# Patient Record
Sex: Female | Born: 1985 | Race: White | Hispanic: No | Marital: Married | Smoking: Former smoker
Health system: Southern US, Community
[De-identification: ages and names within clinical notes are randomized; demographics above are authoritative.]

## PROBLEM LIST (undated history)

## (undated) DIAGNOSIS — F32A Depression, unspecified: Secondary | ICD-10-CM

## (undated) DIAGNOSIS — I341 Nonrheumatic mitral (valve) prolapse: Secondary | ICD-10-CM

## (undated) DIAGNOSIS — F329 Major depressive disorder, single episode, unspecified: Secondary | ICD-10-CM

## (undated) DIAGNOSIS — O24419 Gestational diabetes mellitus in pregnancy, unspecified control: Secondary | ICD-10-CM

## (undated) DIAGNOSIS — F988 Other specified behavioral and emotional disorders with onset usually occurring in childhood and adolescence: Secondary | ICD-10-CM

## (undated) DIAGNOSIS — F419 Anxiety disorder, unspecified: Secondary | ICD-10-CM

## (undated) HISTORY — DX: Major depressive disorder, single episode, unspecified: F32.9

## (undated) HISTORY — DX: Other specified behavioral and emotional disorders with onset usually occurring in childhood and adolescence: F98.8

## (undated) HISTORY — DX: Gestational diabetes mellitus in pregnancy, unspecified control: O24.419

## (undated) HISTORY — DX: Depression, unspecified: F32.A

## (undated) HISTORY — DX: Anxiety disorder, unspecified: F41.9

## (undated) HISTORY — PX: WISDOM TOOTH EXTRACTION: SHX21

---
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
## 2011-06-22 ENCOUNTER — Other Ambulatory Visit: Payer: Self-pay | Admitting: Nurse Practitioner

## 2011-06-22 ENCOUNTER — Other Ambulatory Visit (HOSPITAL_COMMUNITY)
Admission: RE | Admit: 2011-06-22 | Discharge: 2011-06-22 | Disposition: A | Discharge status: Home / Self Care | Payer: Self-pay | Source: Ambulatory Visit | Attending: Unknown Physician Specialty | Admitting: Unknown Physician Specialty

## 2011-06-22 DIAGNOSIS — R87619 Unspecified abnormal cytological findings in specimens from cervix uteri: Secondary | ICD-10-CM | POA: Insufficient documentation

## 2011-06-22 DIAGNOSIS — R87612 Low grade squamous intraepithelial lesion on cytologic smear of cervix (LGSIL): Secondary | ICD-10-CM | POA: Insufficient documentation

## 2013-08-23 ENCOUNTER — Encounter: Payer: Self-pay | Admitting: Women's Health

## 2013-08-23 ENCOUNTER — Other Ambulatory Visit (HOSPITAL_COMMUNITY)
Admission: RE | Admit: 2013-08-23 | Discharge: 2013-08-23 | Disposition: A | Discharge status: Home / Self Care | Payer: Commercial Managed Care - PPO | Source: Ambulatory Visit | Attending: Gynecology | Admitting: Gynecology

## 2013-08-23 ENCOUNTER — Ambulatory Visit (INDEPENDENT_AMBULATORY_CARE_PROVIDER_SITE_OTHER): Payer: Commercial Managed Care - PPO | Admitting: Women's Health

## 2013-08-23 VITALS — BP 122/80 | Ht 67.75 in | Wt 199.0 lb

## 2013-08-23 DIAGNOSIS — Z309 Encounter for contraceptive management, unspecified: Secondary | ICD-10-CM

## 2013-08-23 DIAGNOSIS — F341 Dysthymic disorder: Secondary | ICD-10-CM

## 2013-08-23 DIAGNOSIS — F32A Depression, unspecified: Secondary | ICD-10-CM | POA: Insufficient documentation

## 2013-08-23 DIAGNOSIS — Z01419 Encounter for gynecological examination (general) (routine) without abnormal findings: Secondary | ICD-10-CM

## 2013-08-23 DIAGNOSIS — F419 Anxiety disorder, unspecified: Secondary | ICD-10-CM

## 2013-08-23 DIAGNOSIS — IMO0001 Reserved for inherently not codable concepts without codable children: Secondary | ICD-10-CM

## 2013-08-23 DIAGNOSIS — F329 Major depressive disorder, single episode, unspecified: Secondary | ICD-10-CM

## 2013-08-23 MED ORDER — NORETHINDRONE 0.35 MG PO TABS: 1.0000 | ORAL_TABLET | Freq: Every day | ORAL | Status: DC

## 2013-08-23 NOTE — Patient Instructions (Signed)
Health Maintenance, Female A healthy lifestyle and preventative care can promote health and wellness.  Maintain regular health, dental, and eye exams.  Eat a healthy diet. Foods like vegetables, fruits, whole grains, low-fat dairy products, and lean protein foods contain the nutrients you need without too many calories. Decrease your intake of foods high in solid fats, added sugars, and salt. Get information about a proper diet from your caregiver, if necessary.  Regular physical exercise is one of the most important things you can do for your health. Most adults should get at least 150 minutes of moderate-intensity exercise (any activity that increases your heart rate and causes you to sweat) each week. In addition, most adults need muscle-strengthening exercises on 2 or more days a week.   Maintain a healthy weight. The body mass index (BMI) is a screening tool to identify possible weight problems. It provides an estimate of body fat based on height and weight. Your caregiver can help determine your BMI, and can help you achieve or maintain a healthy weight. For adults 20 years and older:  A BMI below 18.5 is considered underweight.  A BMI of 18.5 to 24.9 is normal.  A BMI of 25 to 29.9 is considered overweight.  A BMI of 30 and above is considered obese.  Maintain normal blood lipids and cholesterol by exercising and minimizing your intake of saturated fat. Eat a balanced diet with plenty of fruits and vegetables. Blood tests for lipids and cholesterol should begin at age 98 and be repeated every 5 years. If your lipid or cholesterol levels are high, you are over 50, or you are a high risk for heart disease, you may need your cholesterol levels checked more frequently.Ongoing high lipid and cholesterol levels should be treated with medicines if diet and exercise are not effective.  If you smoke, find out from your caregiver how to quit. If you do not use tobacco, do not start.  Lung  cancer screening is recommended for adults aged 49 80 years who are at high risk for developing lung cancer because of a history of smoking. Yearly low-dose computed tomography (CT) is recommended for people who have at least a 30-pack-year history of smoking and are a current smoker or have quit within the past 15 years. A pack year of smoking is smoking an average of 1 pack of cigarettes a day for 1 year (for example: 1 pack a day for 30 years or 2 packs a day for 15 years). Yearly screening should continue until the smoker has stopped smoking for at least 15 years. Yearly screening should also be stopped for people who develop a health problem that would prevent them from having lung cancer treatment.  If you are pregnant, do not drink alcohol. If you are breastfeeding, be very cautious about drinking alcohol. If you are not pregnant and choose to drink alcohol, do not exceed 1 drink per day. One drink is considered to be 12 ounces (355 mL) of beer, 5 ounces (148 mL) of wine, or 1.5 ounces (44 mL) of liquor.  Avoid use of street drugs. Do not share needles with anyone. Ask for help if you need support or instructions about stopping the use of drugs.  High blood pressure causes heart disease and increases the risk of stroke. Blood pressure should be checked at least every 1 to 2 years. Ongoing high blood pressure should be treated with medicines, if weight loss and exercise are not effective.  If you are 55 to  28 years old, ask your caregiver if you should take aspirin to prevent strokes.  Diabetes screening involves taking a blood sample to check your fasting blood sugar level. This should be done once every 3 years, after age 88, if you are within normal weight and without risk factors for diabetes. Testing should be considered at a younger age or be carried out more frequently if you are overweight and have at least 1 risk factor for diabetes.  Breast cancer screening is essential preventative care  for women. You should practice "breast self-awareness." This means understanding the normal appearance and feel of your breasts and may include breast self-examination. Any changes detected, no matter how small, should be reported to a caregiver. Women in their 59s and 30s should have a clinical breast exam (CBE) by a caregiver as part of a regular health exam every 1 to 3 years. After age 27, women should have a CBE every year. Starting at age 24, women should consider having a mammogram (breast X-ray) every year. Women who have a family history of breast cancer should talk to their caregiver about genetic screening. Women at a high risk of breast cancer should talk to their caregiver about having an MRI and a mammogram every year.  Breast cancer gene (BRCA)-related cancer risk assessment is recommended for women who have family members with BRCA-related cancers. BRCA-related cancers include breast, ovarian, tubal, and peritoneal cancers. Having family members with these cancers may be associated with an increased risk for harmful changes (mutations) in the breast cancer genes BRCA1 and BRCA2. Results of the assessment will determine the need for genetic counseling and BRCA1 and BRCA2 testing.  The Pap test is a screening test for cervical cancer. Women should have a Pap test starting at age 35. Between ages 72 and 13, Pap tests should be repeated every 2 years. Beginning at age 39, you should have a Pap test every 3 years as long as the past 3 Pap tests have been normal. If you had a hysterectomy for a problem that was not cancer or a condition that could lead to cancer, then you no longer need Pap tests. If you are between ages 31 and 57, and you have had normal Pap tests going back 10 years, you no longer need Pap tests. If you have had past treatment for cervical cancer or a condition that could lead to cancer, you need Pap tests and screening for cancer for at least 20 years after your treatment. If Pap  tests have been discontinued, risk factors (such as a new sexual partner) need to be reassessed to determine if screening should be resumed. Some women have medical problems that increase the chance of getting cervical cancer. In these cases, your caregiver may recommend more frequent screening and Pap tests.  The human papillomavirus (HPV) test is an additional test that may be used for cervical cancer screening. The HPV test looks for the virus that can cause the cell changes on the cervix. The cells collected during the Pap test can be tested for HPV. The HPV test could be used to screen women aged 23 years and older, and should be used in women of any age who have unclear Pap test results. After the age of 104, women should have HPV testing at the same frequency as a Pap test.  Colorectal cancer can be detected and often prevented. Most routine colorectal cancer screening begins at the age of 58 and continues through age 65. However, your caregiver  may recommend screening at an earlier age if you have risk factors for colon cancer. On a yearly basis, your caregiver may provide home test kits to check for hidden blood in the stool. Use of a small camera at the end of a tube, to directly examine the colon (sigmoidoscopy or colonoscopy), can detect the earliest forms of colorectal cancer. Talk to your caregiver about this at age 73, when routine screening begins. Direct examination of the colon should be repeated every 5 to 10 years through age 61, unless early forms of pre-cancerous polyps or small growths are found.  Hepatitis C blood testing is recommended for all people born from [birth year] through 1965 and any individual with known risks for hepatitis C.  Practice safe sex. Use condoms and avoid high-risk sexual practices to reduce the spread of sexually transmitted infections (STIs). Sexually active women aged 38 and younger should be checked for Chlamydia, which is a common sexually transmitted infection.  Older women with new or multiple partners should also be tested for Chlamydia. Testing for other STIs is recommended if you are sexually active and at increased risk.  Osteoporosis is a disease in which the bones lose minerals and strength with aging. This can result in serious bone fractures. The risk of osteoporosis can be identified using a bone density scan. Women ages 44 and over and women at risk for fractures or osteoporosis should discuss screening with their caregivers. Ask your caregiver whether you should be taking a calcium supplement or vitamin D to reduce the rate of osteoporosis.  Menopause can be associated with physical symptoms and risks. Hormone replacement therapy is available to decrease symptoms and risks. You should talk to your caregiver about whether hormone replacement therapy is right for you.  Use sunscreen. Apply sunscreen liberally and repeatedly throughout the day. You should seek shade when your shadow is shorter than you. Protect yourself by wearing long sleeves, pants, a wide-brimmed hat, and sunglasses year round, whenever you are outdoors.  Notify your caregiver of new moles or changes in moles, especially if there is a change in shape or color. Also notify your caregiver if a mole is larger than the size of a pencil eraser.  Stay current with your immunizations. Document Released: 10/12/2010 Document Revised: 07/24/2012 Document Reviewed: 10/12/2010 [facility] Patient Information 2014 [place].

## 2013-08-23 NOTE — Progress Notes (Signed)
Tara IsaacsLacy J Lucero 21-Jul-1985 [id number]    History:    Presents for annual exam.  New patient. 2013 C&B negative after Pap [facility] Negative STD screen. Amenorrheic on Micronor. An[facility]/depression [facility] on Celexa, Lamictal and Neurontin. Tearful most of visit, contemplating a pregnancy in the next year or 2..   Past medical history, past surgical history, family history and social history were all reviewed and documented in the EPIC chart. 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 Mother with severe depression.  ROS:  A  12 point ROS was performed and pertinent positives and negatives are included.  Exam:  Filed Vitals:   08/23/13 1419  BP: 122/80    General appearance:  Normal Thyroid:  Symmetrical, normal in size, without palpable masses or nodularity. Respiratory  Auscultation:  Clear without wheezing or rhonchi Cardiovascular  Auscultation:  Regular rate, without rubs, murmurs or gallops  Edema/varicosities:  Not grossly evident Abdominal  Soft,nontender, without masses, guarding or rebound.  Liver/spleen:  No organomegaly noted  Hernia:  None appreciated  Skin  Inspection:  Grossly normal   Breasts: Examined lying and sitting.     Right: Without masses, retractions, discharge or axillary adenopathy.     Left: Without masses, retractions, discharge or axillary adenopathy. Gentitourinary   Inguinal/mons:  Normal without inguinal adenopathy  External genitalia:  Normal  BUS/Urethra/Skene's glands:  Normal  Vagina:  Normal  Cervix:  Normal  Uterus:  normal in size, shape and contour.  Midline and mobile  Adnexa/parametria:     Rt: Without masses or tenderness.   Lt: Without masses or tenderness.  Anus and perineum: Normal  Digital rectal exam: Normal sphincter tone without palpated masses or tenderness  Assessment/Plan:  28 y.o. MWF G0 for annual exam.    Amenorrheic on  Micronor Anxiety/depression Positive HR HPV with negative colposcopy 2013  Plan: CBC, TSH, rubella titer will have checked at [facility] and have results faxed. SBE's, regular exercise, calcium rich diet, MVI daily encouraged. Micronor prescription, proper use reviewed. Continue counseling, discussed medications currently on are category C some risks for fetal toxicity,  cleft lip and palate/cardiovascular defects. and safety with pregnancy, tapering to lower doses. Aware we no longer deliver. Pap.   Note: This dictation was prepared with Dragon/digital dictation.  Any transcriptional errors that result are unintentional. Harrington Challengerancy J Young [facility]NP, 5:33 PM 08/23/2013

## 2013-10-11 ENCOUNTER — Ambulatory Visit (INDEPENDENT_AMBULATORY_CARE_PROVIDER_SITE_OTHER): Payer: Commercial Managed Care - PPO | Admitting: Women's Health

## 2013-10-11 ENCOUNTER — Encounter: Payer: Self-pay | Admitting: Women's Health

## 2013-10-11 ENCOUNTER — Other Ambulatory Visit: Payer: Self-pay | Admitting: Women's Health

## 2013-10-11 DIAGNOSIS — N912 Amenorrhea, unspecified: Secondary | ICD-10-CM

## 2013-10-11 LAB — HCG, SERUM, QUALITATIVE: Preg, Serum: POSITIVE
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
## 2013-10-11 NOTE — Progress Notes (Signed)
Patient ID: Tara IsaacsLacy J Lucero, female   DOB: 05/21/1985, 28 y.o.   MRN: [id number] Presents with 3 positive home UPT's. Stopped  Micronor June 2,  3 day light dark blood cycle June 11.  Regular 3 day cycle May 16. Had irregular monthly cycles on Micronor. Bipolar on Lamictal, Neurontin, Adderall and Celexa. Has stopped all medication except Celexa states feels anxious when off. On prenatal vitamin daily.  Exam: Appears well. U PT negative,  Possible pregnancy  Plan: HCG qualitative. If positive will schedule viability ultrasound end of July. Discussed trying Celexa 20 mg daily,  continue prenatal vitamins. Safe pregnancy behaviors discussed.

## 2013-10-20 ENCOUNTER — Encounter (HOSPITAL_COMMUNITY): Payer: Self-pay | Admitting: Emergency Medicine

## 2013-10-20 ENCOUNTER — Emergency Department (HOSPITAL_COMMUNITY): Payer: Commercial Managed Care - PPO

## 2013-10-20 ENCOUNTER — Emergency Department (HOSPITAL_COMMUNITY)
Admission: EM | Admit: 2013-10-20 | Discharge: 2013-10-20 | Disposition: A | Discharge status: Home / Self Care | Payer: Commercial Managed Care - PPO | Attending: Emergency Medicine | Admitting: Emergency Medicine

## 2013-10-20 DIAGNOSIS — O200 Threatened abortion: Secondary | ICD-10-CM

## 2013-10-20 DIAGNOSIS — Z8679 Personal history of other diseases of the circulatory system: Secondary | ICD-10-CM | POA: Insufficient documentation

## 2013-10-20 DIAGNOSIS — F329 Major depressive disorder, single episode, unspecified: Secondary | ICD-10-CM | POA: Insufficient documentation

## 2013-10-20 DIAGNOSIS — O99340 Other mental disorders complicating pregnancy, unspecified trimester: Secondary | ICD-10-CM | POA: Insufficient documentation

## 2013-10-20 DIAGNOSIS — Z87891 Personal history of nicotine dependence: Secondary | ICD-10-CM | POA: Insufficient documentation

## 2013-10-20 DIAGNOSIS — Z79899 Other long term (current) drug therapy: Secondary | ICD-10-CM | POA: Insufficient documentation

## 2013-10-20 DIAGNOSIS — F411 Generalized anxiety disorder: Secondary | ICD-10-CM | POA: Insufficient documentation

## 2013-10-20 DIAGNOSIS — F3289 Other specified depressive episodes: Secondary | ICD-10-CM | POA: Insufficient documentation

## 2013-10-20 HISTORY — DX: Nonrheumatic mitral (valve) prolapse: I34.1

## 2013-10-20 LAB — ABO/RH: ABO/RH(D): A POS

## 2013-10-20 LAB — URINALYSIS, ROUTINE W REFLEX MICROSCOPIC
Bilirubin Urine: NEGATIVE
Glucose, UA: NEGATIVE mg/dL
Ketones, ur: NEGATIVE mg/dL
Leukocytes, UA: NEGATIVE
Nitrite: NEGATIVE
Specific Gravity, Urine: 1.015 (ref 1.005–1.030)
Urobilinogen, UA: 0.2 mg/dL (ref 0.0–1.0)
pH: 7 (ref 5.0–8.0)

## 2013-10-20 LAB — CBC WITH DIFFERENTIAL/PLATELET
Basophils Absolute: 0 10*3/uL (ref 0.0–0.1)
Basophils Relative: 0 % (ref 0–1)
Eosinophils Absolute: 0.2 10*3/uL (ref 0.0–0.7)
Eosinophils Relative: 2 % (ref 0–5)
HCT: 40 % (ref 36.0–46.0)
Hemoglobin: 13.1 g/dL (ref 12.0–15.0)
Lymphocytes Relative: 26 % (ref 12–46)
Lymphs Abs: 2.5 10*3/uL (ref 0.7–4.0)
MCH: 29.4 pg (ref 26.0–34.0)
MCHC: 32.8 g/dL (ref 30.0–36.0)
MCV: 89.7 fL (ref 78.0–100.0)
Monocytes Absolute: 0.7 10*3/uL (ref 0.1–1.0)
Monocytes Relative: 7 % (ref 3–12)
Neutro Abs: 6.3 10*3/uL (ref 1.7–7.7)
Neutrophils Relative %: 65 % (ref 43–77)
Platelets: 221 10*3/uL (ref 150–400)
RBC: 4.46 MIL/uL (ref 3.87–5.11)
RDW: 13 % (ref 11.5–15.5)
WBC: 9.7 10*3/uL (ref 4.0–10.5)

## 2013-10-20 LAB — URINE MICROSCOPIC-ADD ON

## 2013-10-20 LAB — BASIC METABOLIC PANEL
Anion gap: 11 (ref 5–15)
BUN: 12 mg/dL (ref 6–23)
CO2: 25 mEq/L (ref 19–32)
Calcium: 9 mg/dL (ref 8.4–10.5)
Chloride: 103 mEq/L (ref 96–112)
Creatinine, Ser: 0.86 mg/dL (ref 0.50–1.10)
GFR calc Af Amer: >90 mL/min (ref >90)
GFR calc non Af Amer: >90 mL/min (ref >90)
Glucose, Bld: 98 mg/dL (ref 70–99)
Potassium: 4 mEq/L (ref 3.7–5.3)
Sodium: 139 mEq/L (ref 137–147)

## 2013-10-20 LAB — WET PREP, GENITAL
Clue Cells Wet Prep HPF POC: NONE SEEN
Trich, Wet Prep: NONE SEEN
Yeast Wet Prep HPF POC: NONE SEEN

## 2013-10-20 LAB — RPR

## 2013-10-20 LAB — HIV ANTIBODY (ROUTINE TESTING W REFLEX): HIV 1&2 Ab, 4th Generation: NONREACTIVE

## 2013-10-20 LAB — HCG, QUANTITATIVE, PREGNANCY: hCG, Beta Chain, Quant, S: 74 m[IU]/mL — ABNORMAL HIGH (ref <5)

## 2013-10-20 NOTE — ED Provider Notes (Signed)
CSN: [id number]     Arrival date & time 10/20/13  1245 History   First MD Initiated Contact with Patient 10/20/13 1353     This chart was scribed for Ward Givens, MD by Arlan Organ, ED Scribe. This patient was seen in room APA12/APA12 and the patient's care was started 2:43 PM.   Chief Complaint  Patient presents with  . Abdominal Pain   HPI  HPI Comments: Tara Lucero is a 28 y.o. G8P1Ab0  female with a PMHx of anxiety, depression, ADD who presents to the Emergency Department complaining of constant, moderate abdominal pain onset this morning around 12 noon. Currently she rates pain 6/10 and states abdominal pain is alleviated temporarily after bowel movements. She also reports vaginal bleeding that started about 8 AM the same consistency of a menstrual period. She is having small clots which is not unusual. She has not passed anything gray or sounds like tissue. No fever or chills at this time. Pt recently stopped taking her birth control 6/2 (planned). LMP 09/12/13 but was shorter than usual, her last normal period was May 16.. She reports a positive home pregnancy test on 6/20 and a postive blood test on 7/2 at her OB/GYN office.. Pt is currently a respiratory therapist at [employer]. No other concerns this visit.  Pt is followed by [facility] for Anxiety, Depression, and ADD PCP none  Past Medical History  Diagnosis Date  . Anxiety   . ADD (attention deficit disorder)   . Depression   . Mitral valve prolapse    History reviewed. No pertinent past surgical history. Family History  Problem Relation Age of Onset  . COPD Maternal Grandfather    History  Substance Use Topics  . Smoking status: Former Smoker    Quit date: 08/11/2011  . Smokeless tobacco: Never Used  . Alcohol Use: No   Employed as a respiratory therapist   OB History   Grav Para Term Preterm Abortions TAB SAB Ect Mult Living   0    0          Review of Systems  Constitutional: Negative  for fever and chills.  Gastrointestinal: Positive for abdominal pain.  Genitourinary: Positive for vaginal bleeding.  Musculoskeletal: Positive for back pain.      Allergies  Review of patient's allergies indicates no known allergies.  Home Medications   Prior to Admission medications   Medication Sig Start Date End Date Taking? Authorizing Provider  citalopram (CELEXA) 40 MG tablet Take 40 mg by mouth daily.   Yes Historical Provider, MD  Prenatal Vit-Fe Fumarate-FA (PRENATAL MULTIVITAMIN) TABS tablet Take 1 tablet by mouth daily at 12 noon.   Yes Historical Provider, MD   Triage Vitals: BP 139/87  Pulse 85  Temp(Src) 98.1 F (36.7 C) (Oral)  Resp 18  Ht 5\' 7"  (1.702 m)  Wt 200 lb (90.719 kg)  BMI 31.32 kg/m2  SpO2 100%  LMP 09/14/2013   Vital signs normal    Physical Exam  Nursing note and vitals reviewed. Constitutional: She is oriented to person, place, and time. She appears well-developed and well-nourished.  HENT:  Head: Normocephalic and atraumatic.  Right Ear: External ear normal.  Left Ear: External ear normal.  Nose: Nose normal.  Mouth/Throat: Oropharynx is clear and moist.  Eyes: Conjunctivae and EOM are normal. Pupils are equal, round, and reactive to light.  Neck: Normal range of motion. Neck supple.  Cardiovascular: Normal rate, regular rhythm and normal heart sounds.  Pulmonary/Chest: Effort normal and breath sounds normal.  Abdominal: Soft. Bowel sounds are normal. There is tenderness.    Genitourinary:  Patient has normal external genitalia. She is noted to have some blood in the vault. It is not bright red or brown. Her cervix is bluish in color (Chadwick's sign). Her uterus and left adnexa feel normal and are minimally tender. Her right adnexa however is very tender without masses felt. Her cervix feels firm and feels closed.  Musculoskeletal: Normal range of motion. She exhibits tenderness.  Lower sacral tenderness  Neurological: She is alert  and oriented to person, place, and time.  Skin: Skin is warm and dry.  Psychiatric: She has a normal mood and affect. Her behavior is normal.    ED Course  Procedures (including critical care time)  DIAGNOSTIC STUDIES: Oxygen Saturation is 100% on RA, Normal by my interpretation.    COORDINATION OF CARE: 3:00 PM- Will order Urinalysis, hcG, CBC, and BMP. Discussed treatment plan with pt at bedside and pt agreed to plan.    Bedside pelvic ultrasound was done during my initial interview which did not show any intrauterine pregnancy. After her pelvic exam with significant pain over the right adnexa a in-house pelvic ultrasound was ordered.  On review of her prior tests patient only had a qualitative beta hCG done on July 2. It was positive.  We had a long discussion about what is going on today. We discussed that with her positive pregnancy test on June 20 and her beta hCG only been 8874 today this could be a sign of a pregnancy that is being lost. She also could be a very early pregnancy with a threatened miscarriage. And also to be considered as a possible ectopic pregnancy. However there was no free fluid seen on ultrasound and no masses on her ovary or her fallopian tubes. We discussed the importance of getting a repeat beta hCG levels on in 2 days which would be Monday, July 13. I gave her a prescription so [facility]. She can then followup with her GYN office later that day. We also discussed pelvic rest. [facility] if  her pain gets worse or she starts bleeding heavier than a regular period.   EMERGENCY DEPARTMENT US PREGNANCY "Study: Limited Ultrasound of the Pelvis"  INDICATIONS:Pregnancy(required) and Vaginal bleeding Multiple views of the uterus and pelvic cavity are obtained with a multi-frequency probe.  APPROACH:Transabdominal   PERFORMED BY: Myself  IMAGES ARCHIVED?:  Yes  LIMITATIONS: Decompressed bladder  PREGNANCY FREE FLUID: None  PREGNANCY UTERUS FINDINGS:Uterus normal size ADNEXAL FINDINGS: not studied  PREGNANCY FINDINGS: No yolk sac noted and No fetal pole seen  INTERPRETATION: No visualized intrauterine pregnancy and Pelvic free fluid absent       Labs Review Results for orders placed during the hospital encounter of 10/20/13  WET PREP, GENITAL      Result Value Ref Range   Yeast Wet Prep HPF POC NONE SEEN  NONE SEEN   Trich, Wet Prep NONE SEEN  NONE SEEN   Clue Cells Wet Prep HPF POC NONE SEEN  NONE SEEN   WBC, Wet Prep HPF POC FEW (*) NONE SEEN  CBC WITH DIFFERENTIAL      Result Value Ref Range   WBC 9.7  4.0 - 10.5 K/uL   RBC 4.46  3.87 - 5.11 MIL/uL   Hemoglobin 13.1  12.0 - 15.0 g/dL  HCT 40.0  36.0 - 46.0 %   MCV 89.7  78.0 - 100.0 fL   MCH 29.4  26.0 - 34.0 pg   MCHC 32.8  30.0 - 36.0 g/dL   RDW 16.1  09.6 - 04.5 %   Platelets 221  150 - 400 K/uL   Neutrophils Relative % 65  43 - 77 %   Neutro Abs 6.3  1.7 - 7.7 K/uL   Lymphocytes Relative 26  12 - 46 %   Lymphs Abs 2.5  0.7 - 4.0 K/uL   Monocytes Relative 7  3 - 12 %   Monocytes Absolute 0.7  0.1 - 1.0 K/uL   Eosinophils Relative 2  0 - 5 %   Eosinophils Absolute 0.2  0.0 - 0.7 K/uL   Basophils Relative 0  0 - 1 %   Basophils Absolute 0.0  0.0 - 0.1 K/uL  BASIC METABOLIC PANEL      Result Value Ref Range   Sodium 139  137 - 147 mEq/L   Potassium 4.0  3.7 - 5.3 mEq/L   Chloride 103  96 - 112 mEq/L   CO2 25  19 - 32 mEq/L   Glucose, Bld 98  70 - 99 mg/dL   BUN 12  6 - 23 mg/dL   Creatinine, Ser 4.09  0.50 - 1.10 mg/dL   Calcium 9.0  8.4 - 81.1 mg/dL   GFR calc non Af Amer >90  >90 mL/min   GFR calc Af Amer >90  >90 mL/min   Anion gap 11  5 - 15  URINALYSIS, ROUTINE W REFLEX MICROSCOPIC      Result Value Ref Range   Color, Urine RED (*) YELLOW   APPearance CLOUDY (*) CLEAR   Specific Gravity, Urine 1.015  1.005 - 1.030   pH 7.0  5.0 - 8.0   Glucose,  UA NEGATIVE  NEGATIVE mg/dL   Hgb urine dipstick LARGE (*) NEGATIVE   Bilirubin Urine NEGATIVE  NEGATIVE   Ketones, ur NEGATIVE  NEGATIVE mg/dL   Protein, ur TRACE (*) NEGATIVE mg/dL   Urobilinogen, UA 0.2  0.0 - 1.0 mg/dL   Nitrite NEGATIVE  NEGATIVE   Leukocytes, UA NEGATIVE  NEGATIVE  HCG, QUANTITATIVE, PREGNANCY      Result Value Ref Range   hCG, Beta Chain, Quant, S 74 (*) <5 mIU/mL  URINE MICROSCOPIC-ADD ON      Result Value Ref Range   Squamous Epithelial / LPF FEW (*) RARE   RBC / HPF TOO NUMEROUS TO COUNT  <3 RBC/hpf   Bacteria, UA FEW (*) RARE   Urine-Other SPERM PRESENT    ABO/RH      Result Value Ref Range   ABO/RH(D) A POS     Laboratory interpretation all normal except very low quantitative beta hCG, hematuria and a voided urine with vaginal bleeding     Imaging Review US Ob Comp Less 14 Wks US Ob Transvaginal  10/20/2013   CLINICAL DATA:  Right lower quadrant pelvic pain, vaginal bleeding. Positive pregnancy test.  EXAM: OBSTETRIC <14 WK [place] AND TRANSVAGINAL OB US  TECHNIQUE: Both transabdominal and transvaginal ultrasound examinations were performed for complete evaluation of the gestation as well as the maternal uterus, adnexal regions, and pelvic cul-de-sac. Transvaginal technique was performed to assess early pregnancy.  COMPARISON:  None.  FINDINGS: Intrauterine gestational sac: Not visualized  Yolk sac:  Not visualized  Embryo:  Not visualized  Cardiac Activity: Not visualized  Maternal uterus/adnexae: The ovaries are normal.  0.9 cm right paraovarian simple appearing cyst incidentally noted. No adnexal mass. No free fluid.  IMPRESSION: No intrauterine gestational sac, yolk sac, or fetal pole identified. Differential considerations include intrauterine pregnancy too early to be sonographically visualized, missed abortion, or ectopic pregnancy. Followup ultrasound is recommended in 10-14 days for further evaluation.   Electronically Signed   By: Christiana Pellant M.D.    On: 10/20/2013 18:44     EKG Interpretation None      MDM   Final diagnoses:  Threatened miscarriage in early pregnancy    Plan discharge  Devoria Albe, MD, FACEP   I personally performed the services described in this documentation, which was scribed in my presence. The recorded information has been reviewed and considered.  Devoria Albe, MD, Armando Gang     Ward Givens, MD 10/20/13 2106

## 2013-10-20 NOTE — ED Notes (Signed)
PT woke up this morning around 0800 with lower back pain and suprapubic pain. PT started bleeding bright red blood vaginally about 1215 today. PT states she is around [redacted]wks pregnant.

## 2013-10-20 NOTE — ED Notes (Signed)
Patient relates she is [redacted] weeks pregnant.  Got pregnant immediately after stopping BC, prior to having a cycle.

## 2013-10-20 NOTE — Discharge Instructions (Signed)
You need to have a repeat pregnancy hormone level (B HCG) done in two days which will be Monday, July 13th. If you get worsening pain, bleeding heavier than a period or you feel worse, [facility] to the Maternity Ad[facility] Unit. You can take tylenol if needed for pain. You pregnancy hormone level today was only 74. Your blood type is A +. There was no evidence of infection.  No douching, tampons or sex until you see your gynecologist this week in the office.    Vaginal Bleeding During Pregnancy, First Trimester A small amount of bleeding (spotting) from the vagina is relatively common in early pregnancy. It usually stops on its own. Various things may cause bleeding or spotting in early pregnancy. Some bleeding may be related to the pregnancy, and some may not. In most cases, the bleeding is normal and is not a problem. However, bleeding can also be a sign of something serious. Be sure to tell your health care provider about any vaginal bleeding right away. Some possible causes of vaginal bleeding during the first trimester include:  Infection or inflammation of the cervix.  Growths (polyps) on the cervix.  Miscarriage or threatened miscarriage.  Pregnancy tissue has developed outside of the uterus and in a fallopian tube (tubal pregnancy).  Tiny cysts have developed in the uterus instead of pregnancy tissue (molar pregnancy). HOME CARE INSTRUCTIONS  Watch your condition for any changes. The following actions may help to lessen any discomfort you are feeling:  Follow your health care provider's instructions for limiting your activity. If your health care provider orders bed rest, you may need to stay in bed and only get up to use the bathroom. However, your health care provider may allow you to continue light activity.  If needed, make plans for someone to help with your regular activities and responsibilities while you are on bed rest.  Keep track of the number of pads you use  each day, how often you change pads, and how soaked (saturated) they are. Write this down.  Do not use tampons. Do not douche.  Do not have sexual intercourse or orgasms until approved by your health care provider.  If you pass any tissue from your vagina, save the tissue so you can show it to your health care provider.  Only take over-the-counter or prescription medicines as directed by your health care provider.  Do not take aspirin because it can make you bleed.  Keep all follow-up appointments as directed by your health care provider. SEEK MEDICAL CARE IF:  You have any vaginal bleeding during any part of your pregnancy.  You have cramps or labor pains.  You have a fever, not controlled by medicine. SEEK IMMEDIATE MEDICAL CARE IF:   You have severe cramps in your back or belly (abdomen).  You pass large clots or tissue from your vagina.  Your bleeding increases.  You feel light-headed or weak, or you have fainting episodes.  You have chills.  You are leaking fluid or have a gush of fluid from your vagina.  You pass out while having a bowel movement. MAKE SURE YOU:  Understand these instructions.  Will watch your condition.  Will get help right away if you are not doing well or get worse. Document Released: 01/06/2005 Document Revised: 04/03/2013 Document Reviewed: 12/04/2012 [employer] Patient Information 2015 [place]. This information is not intended to replace advice given to you by your health care provider. Make sure you discuss any questions you have  with your health care provider.    Human Chorionic Gonadotropin (hCG) This is a test to confirm and monitor pregnancy or to diagnose trophoblastic disease or germ cell tumors. As early as 10 days after a missed menstrual period (some methods can detect hCG even earlier, at one week after conception) or if your caregiver thinks that your symptoms suggest ectopic pregnancy, a failing pregnancy, trophoblastic  disease, or germ cell tumors. hCG is a protein produced in the placenta of a pregnant woman. A pregnancy test is a specific blood or urine test that can detect hCG and confirm pregnancy. This hormone is able to be detected 10 days after a missed menstrual period, the time period when the fertilized egg is implanted in the woman's uterus. With some methods, hCG can be detected even earlier, at one week after conception.  During the early weeks of pregnancy, hCG is important in maintaining function of the corpus luteum (the mass of cells that forms from a mature egg). Production of hCG increases steadily during the first trimester (8-10 weeks), peaking around the 10th week after the last menstrual cycle. Levels then fall slowly during the remainder of the pregnancy. hCG is no longer detectable within a few weeks after delivery. hCG is also produced by some germ cell tumors and increased levels are seen in trophoblastic disease. SAMPLE COLLECTION hCG is commonly detected in urine. The preferred specimen is a random urine sample collected first thing in the morning. hCG can also be measured in blood drawn from a vein in the arm. NORMAL FINDINGS Qualitative:negative in non-pregnant women; positive in pregnancy Quantitative:   Gestation less than 1 week: 5-50 Whole HCG (milli-international units/mL)  Gestation of 2 weeks: 50-500 Whole HCG (milli-international units/mL)  Gestation of 3 weeks: 100-10,000 Whole HCG (milli-international units/mL)  Gestation of 4 weeks: 1,000-30,000 Whole HCG (milli-international units/mL)  Gestation of 5 weeks 3,500-115,000 Whole HCG (milli-international units/mL)  Gestation of 6-8 weeks: 12,000-270,000 Whole HCG (milli-international units/mL)  Gestation of 12 weeks: 15,000-220,000 Whole HCG (milli-international units/mL)  Males and non-pregnant females: less than 5 Whole HCG (milli-international units/mL) Beta subunit: depends on the method and test used Ranges for  normal findings may vary among different laboratories and hospitals. You should always check with your doctor after having lab work or other tests done to discuss the meaning of your test results and whether your values are considered within normal limits. MEANING OF TEST  Your caregiver will go over the test results with you and discuss the importance and meaning of your results, as well as treatment options and the need for additional tests if necessary. OBTAINING THE TEST RESULTS It is your responsibility to obtain your test results. Ask the lab or department performing the test when and how you will get your results. Document Released: 04/30/2004 Document Revised: 06/21/2011 Document Reviewed: 03/09/2008 [facility] Patient Information 2015 [place], [place]. This information is not intended to replace advice given to you by your health care provider. Make sure you discuss any questions you have with your health care provider.

## 2013-10-22 ENCOUNTER — Other Ambulatory Visit: Payer: Self-pay | Admitting: Gynecology

## 2013-10-22 ENCOUNTER — Ambulatory Visit (INDEPENDENT_AMBULATORY_CARE_PROVIDER_SITE_OTHER): Payer: Commercial Managed Care - PPO | Admitting: Gynecology

## 2013-10-22 ENCOUNTER — Encounter: Payer: Self-pay | Admitting: Gynecology

## 2013-10-22 ENCOUNTER — Telehealth: Payer: Self-pay

## 2013-10-22 DIAGNOSIS — O200 Threatened abortion: Secondary | ICD-10-CM

## 2013-10-22 LAB — GC/CHLAMYDIA PROBE AMP
CT Probe RNA: NEGATIVE
GC Probe RNA: NEGATIVE

## 2013-10-22 LAB — HCG, QUANTITATIVE, PREGNANCY: hCG, Beta Chain, Quant, S: 97.03 m[IU]/mL

## 2013-10-22 NOTE — Telephone Encounter (Signed)
I noticed as I was calling patient that she has an appt at 11am.  I left message on her voice mail that I will add quant order and she can just have it drawn when she comes in this morning.

## 2013-10-22 NOTE — Progress Notes (Signed)
   The patient is a 28 year old gravida 1 para 0 AB 0 who was seen in the emergency room in July 11 complaints and cramping vaginal bleeding. Patient reports last menstrual cycle 09/12/2013. Patient was complaining of some bleeding still present today and mild cramping. Laboratory as well as ultrasound evaluation for emergency room visit as follows:  Ultrasound: IMPRESSION:  No intrauterine gestational sac, yolk sac, or fetal pole identified.  Differential considerations include intrauterine pregnancy too early  to be sonographically visualized, missed abortion, or ectopic  pregnancy. Followup ultrasound is recommended in 10-14 days for  further evaluation.  CBC: Hemoglobin and hematocrit 13.1 and 40.0 respectively with a platelet count 221,000  Quantitative beta hCGs as follows: Results for Demetrios IsaacsGREGG, Shariya J (MRN [id number]) as of 10/22/2013 11:36  Ref. Range 10/20/2013 14:07 10/20/2013 16:06 10/22/2013 09:20  hCG, Beta Chain, Quant, S No range found 74 (H)  97.03   Blood type A positive  Exam: Abdomen: Soft nontender no rebound or guarding Pelvic: Bartholin urethra Skene was within normal limits Vagina some blood was present in the vaginal vault Cervix: No active bleeding Uterus: Upper limits of normal nontender no palpable masses Adnexa: No palpable mass or tenderness Rectal exam: Not done  Assessment/plan: First trimester threatened AB poorly rising quantitative beta-hCG. She will followup with a quantitative beta-hCG at the end of the week to monitor the trend  and an ultrasound the following week. Instructions and precautions given to the patient. Patient with no prior history of PID or pelvic surgery. We did discuss the small possibility of an ectopic pregnancy although not clinically evident at this time and too early to detect. All questions answered we'll follow accordingly.

## 2013-10-22 NOTE — Telephone Encounter (Signed)
Patient called back to tell me that [facility] and that is why she is coming at 11am so that Dr. Glenetta HewJF could hopefully tell her about those results.

## 2013-10-22 NOTE — Telephone Encounter (Signed)
Dr. Glenetta HewJF sent staff message "Please call and have her come in for a quantitative HCG, Her quant was only 74 and need to see if increasing, come today or tomorrow. She was seen in the ER. (She is a Engineer, civil (consulting)nurse at [employer]) ."

## 2013-10-24 ENCOUNTER — Telehealth: Payer: Self-pay | Admitting: *Deleted

## 2013-10-24 NOTE — Telephone Encounter (Signed)
Pt asked if you would call her to speak with her about OV on 10/22/13 when able. Pt # [phone]

## 2013-10-25 NOTE — Telephone Encounter (Signed)
Telephone call, questions answered regarding quantitative hCG, will return to office for repeat HCG 517/2015.

## 2013-10-26 ENCOUNTER — Other Ambulatory Visit: Payer: Commercial Managed Care - PPO

## 2013-10-26 ENCOUNTER — Other Ambulatory Visit: Payer: Self-pay | Admitting: Women's Health

## 2013-10-26 ENCOUNTER — Other Ambulatory Visit: Payer: Self-pay | Admitting: *Deleted

## 2013-10-26 DIAGNOSIS — O200 Threatened abortion: Secondary | ICD-10-CM

## 2013-10-26 LAB — HCG, QUANTITATIVE, PREGNANCY: HCG, BETA CHAIN, QUANT, S: 113.5 m[IU]/mL

## 2013-10-26 NOTE — Telephone Encounter (Signed)
Telephone call, reviewed qualitative hCG - 113,  97 on 10/26/2013, 74 on 10/20/2013. A+ blood type. Not bleeding, no pain, will repeat qualitative hCG in one week. Instructed to schedule lab appointment. Call if heavy bleeding or abdominal pain.

## 2013-10-26 NOTE — Telephone Encounter (Signed)
Message left to call regarding Quant.

## 2013-10-31 ENCOUNTER — Telehealth: Payer: Self-pay

## 2013-10-31 ENCOUNTER — Other Ambulatory Visit: Payer: Self-pay | Admitting: Women's Health

## 2013-10-31 NOTE — Telephone Encounter (Signed)
Patient informed. Tara Lucero notified to cancel u/s.  Appt made for quant Friday and order is already in.

## 2013-10-31 NOTE — Telephone Encounter (Signed)
Patient called to cancel her u/s scheduled for Friday but the appointment desk wanted to check with you to be sure cancelling it was the appropriate thing to do. Patient tells us she has miscarried and her quants are not rising like they should.  She started bleeding again yesterday. She said she needs to have Quant checked and wondered if she could just come Friday for the quant and cancel the u/s?

## 2013-10-31 NOTE — Telephone Encounter (Signed)
Her quantitative beta-hCG has gone up but very slow not what we would like to see in  a normal pregnancy. Let's cancel the ultrasound for Friday and have her come by for quantitative beta-hCG on Friday and we will give her instructions as to when to come back for an ultrasound or  additional blood tests to monitor the trend

## 2013-11-02 ENCOUNTER — Telehealth: Payer: Self-pay

## 2013-11-02 ENCOUNTER — Other Ambulatory Visit: Payer: Commercial Managed Care - PPO

## 2013-11-02 ENCOUNTER — Ambulatory Visit: Payer: Commercial Managed Care - PPO | Admitting: Women's Health

## 2013-11-02 ENCOUNTER — Other Ambulatory Visit: Payer: Self-pay | Admitting: Gynecology

## 2013-11-02 ENCOUNTER — Other Ambulatory Visit: Payer: Self-pay | Admitting: Women's Health

## 2013-11-02 DIAGNOSIS — O200 Threatened abortion: Secondary | ICD-10-CM

## 2013-11-02 DIAGNOSIS — O021 Missed abortion: Secondary | ICD-10-CM

## 2013-11-02 LAB — HCG, QUANTITATIVE, PREGNANCY: hCG, Beta Chain, Quant, S: 185.5 m[IU]/mL

## 2013-11-02 NOTE — Telephone Encounter (Signed)
Telephone call, reviewed Quant 185, states bled like a regular period Monday 10/29/13 to Wednesday lighter bleeding  on 11/01/2013. States was like a regular period, minimal pain doing well today. Repeat quant in one week. Will call to schedule.

## 2013-11-02 NOTE — Telephone Encounter (Signed)
I called and left a message that you would be calling  Please call, have her repeat Quant on Monday or Tuesday, ultrasound Tues or Wednesday if Sharene ButtersQuant is still rising. Plan made with Dr. Lily PeerFernandez

## 2013-11-02 NOTE — Telephone Encounter (Signed)
I called and left detailed message. Asked her to call and let me know if she wants to come [place] or T[place] quant and what time and i will make lab appt for her.  I told her u/s is scheduled for Weds 2:00pm in the even the [place] per [place].  I did let her know [place] off next week and appt with JF.

## 2013-11-02 NOTE — Telephone Encounter (Signed)
Don't call I have already talked to , will talk to you

## 2013-11-02 NOTE — Telephone Encounter (Signed)
Tara Lucero, I so thought I put your name on the order because patient wanted you to call her about this. However, it went to Dr. Glenetta HewJF and his result note reads "Inform patient Tara ButtersQuant rising although slowly. [facility] Monday with follow up ultrasound Tuesday if it has not been scheduled yet."  I can call patient and let her know if you agree with this.

## 2013-11-02 NOTE — Telephone Encounter (Signed)
Calling for quant results from this morning.

## 2013-11-05 NOTE — Telephone Encounter (Signed)
Patient informed. Appt scheduled for tomorrow for Quant and u/s tentatively for Weds if Sharene Buttersquant is rising.

## 2013-11-06 ENCOUNTER — Ambulatory Visit (INDEPENDENT_AMBULATORY_CARE_PROVIDER_SITE_OTHER): Payer: Commercial Managed Care - PPO

## 2013-11-06 ENCOUNTER — Ambulatory Visit (INDEPENDENT_AMBULATORY_CARE_PROVIDER_SITE_OTHER): Payer: Commercial Managed Care - PPO | Admitting: Gynecology

## 2013-11-06 ENCOUNTER — Telehealth: Payer: Self-pay | Admitting: *Deleted

## 2013-11-06 ENCOUNTER — Other Ambulatory Visit: Payer: Self-pay | Admitting: Gynecology

## 2013-11-06 ENCOUNTER — Other Ambulatory Visit: Payer: Commercial Managed Care - PPO

## 2013-11-06 DIAGNOSIS — N838 Other noninflammatory disorders of ovary, fallopian tube and broad ligament: Secondary | ICD-10-CM

## 2013-11-06 DIAGNOSIS — N83209 Unspecified ovarian cyst, unspecified side: Secondary | ICD-10-CM

## 2013-11-06 DIAGNOSIS — O9989 Other specified diseases and conditions complicating pregnancy, childbirth and the puerperium: Secondary | ICD-10-CM

## 2013-11-06 DIAGNOSIS — N912 Amenorrhea, unspecified: Secondary | ICD-10-CM

## 2013-11-06 DIAGNOSIS — O200 Threatened abortion: Secondary | ICD-10-CM

## 2013-11-06 DIAGNOSIS — N839 Noninflammatory disorder of ovary, fallopian tube and broad ligament, unspecified: Secondary | ICD-10-CM

## 2013-11-06 DIAGNOSIS — O039 Complete or unspecified spontaneous abortion without complication: Secondary | ICD-10-CM

## 2013-11-06 DIAGNOSIS — R102 Pelvic and perineal pain: Secondary | ICD-10-CM

## 2013-11-06 DIAGNOSIS — O009 Unspecified ectopic pregnancy without intrauterine pregnancy: Secondary | ICD-10-CM

## 2013-11-06 DIAGNOSIS — N949 Unspecified condition associated with female genital organs and menstrual cycle: Secondary | ICD-10-CM

## 2013-11-06 LAB — HCG, QUANTITATIVE, PREGNANCY: HCG, BETA CHAIN, QUANT, S: 179.6 m[IU]/mL

## 2013-11-06 LAB — US OB TRANSVAGINAL

## 2013-11-06 NOTE — Telephone Encounter (Signed)
Pt called c/o right lower quad pain had Beta Quant HCG level drawn today. Pt is not having any bleeding just lower quad pain, pt will be here at 2:00pm.

## 2013-11-06 NOTE — Progress Notes (Signed)
The patient is a 28 year old gravida 1 para 0 who presented today because of mild cramping as a result of her history of suspicious miscarriage versus early ectopic pregnancy. Her history is as follows:  Patient had been seen in the emergency room on July 11 complaining of cramping and vaginal bleeding. She had reported a last normal menstrual cycle 09/12/2013. The ultrasound in the emergency room on that date was as follows: No intrauterine gestational sac, yolk sac, or fetal pole identified.  Differential considerations include intrauterine pregnancy too early  to be sonographically visualized, missed abortion, or ectopic  pregnancy. Followup ultrasound is recommended in 10-14 days for  further evaluation.  CBC: Hemoglobin and hematocrit 13.1 and 40.0 respectively with a platelet count 221,000. Blood type A+  Her quantitative beta hCG was 74.  She was diagnosed with a first trimester threatened AB and was to be followed with serial quantitative beta hCGs.  Her serial quantitative beta hCGs have been as follows: Results for Tara Lucero, Tara Lucero (MRN [id number]) as of 11/06/2013 17:17  Ref. Range 10/22/2013 09:20 10/26/2013 08:38 11/02/2013 08:45  hCG, Beta Chain, Quant, S No range found 97.03 113.5 185.5  She stated approximately 10 days ago she bled for 5 days like a menstrual cycle.. An ultrasound was done today in the office and quantitative hCG is pending at time of this dictation which demonstrated the following:  Anteverted uterus tried layered endometrial cavity right ovary with a thin wall echo free follicle measuring 29 x 20 x 27 mm avascular left ovary with a thick walled solid focus at the wall of the left ovary with arterial blood flow seen measuring 10 x 10 x 18 mm. No fluid in the cul-de-sac right adnexa negative no intrauterine pregnancy seen.   Exam: Abdomen: Soft nontender no rebound or guarding positive bowel sounds negative Rovsing negative obturator negative heel tap sign Pelvic:  Bartholin urethra Skene was within normal limits Vagina: No lesions or discharge Cervix: No lesions or discharge Uterus anteverted mobile nontender right and left fact next mild tenderness no rebound or guarding no discernible masses Rectal exam: Not done  Assessment/plan: Poorly rising quantitative beta-hCGs complete AB versus early ectopic pregnancy. We had a lengthy discussion with her husband present that depending on the results of this quantitative beta hCG that was drawn today if there continues to be a poor rise in her quantitative beta-hCG with the findings on today's ultrasound that she will be a candidate for methotrexate. We discussed the risk and benefits and pros and cons of methotrexate. If the quantitative beta-hCG appears to be decreasing we will hold off on the methotrexate and continue to follow with weekly quantitative beta hCGs.  Addendum:  Patient's quantitative beta-hCG from yesterday that demonstrated a slight drop from 185.5 on July 24 to 179.6. I spoke with the patient is morning she had an uneventful evening at home. With the findings on ultrasound and the leveling  off the quantitative beta-hCG it makes it highly suspicious that she has a small ectopic pregnancy and will follow the methotrexate protocol. A detail explanation of the protocol as well as risks benefits and pros and cons of methotrexate were discussed with the patient. We'll be doing liver function tests today along with kidney function test before she receives  The  methotrexate. She will then return [facility] Sunday which will be day  4  from the date of administration of methotrexate for a followup quantitative beta-hCG and then on day  7 which would be next week we will check her quantitative beta-hCG in office and follow the trend.

## 2013-11-07 ENCOUNTER — Inpatient Hospital Stay (HOSPITAL_COMMUNITY)
Admission: AD | Admit: 2013-11-07 | Discharge: 2013-11-07 | Disposition: A | Discharge status: Home / Self Care | Payer: Commercial Managed Care - PPO | Source: Ambulatory Visit | Attending: Gynecology | Admitting: Gynecology

## 2013-11-07 ENCOUNTER — Telehealth: Payer: Self-pay | Admitting: *Deleted

## 2013-11-07 ENCOUNTER — Encounter (HOSPITAL_COMMUNITY): Payer: Self-pay | Admitting: *Deleted

## 2013-11-07 ENCOUNTER — Other Ambulatory Visit: Payer: Commercial Managed Care - PPO

## 2013-11-07 ENCOUNTER — Ambulatory Visit: Payer: Commercial Managed Care - PPO | Admitting: Gynecology

## 2013-11-07 DIAGNOSIS — O00109 Unspecified tubal pregnancy without intrauterine pregnancy: Secondary | ICD-10-CM | POA: Diagnosis present

## 2013-11-07 LAB — CBC WITH DIFFERENTIAL/PLATELET
Basophils Absolute: 0 10*3/uL (ref 0.0–0.1)
Basophils Relative: 0 % (ref 0–1)
EOS PCT: 1 % (ref 0–5)
Eosinophils Absolute: 0.1 10*3/uL (ref 0.0–0.7)
HCT: 38.7 % (ref 36.0–46.0)
HEMOGLOBIN: 12.7 g/dL (ref 12.0–15.0)
LYMPHS ABS: 3.3 10*3/uL (ref 0.7–4.0)
LYMPHS PCT: 32 % (ref 12–46)
MCH: 29.1 pg (ref 26.0–34.0)
MCHC: 32.8 g/dL (ref 30.0–36.0)
MCV: 88.6 fL (ref 78.0–100.0)
MONOS PCT: 7 % (ref 3–12)
Monocytes Absolute: 0.8 10*3/uL (ref 0.1–1.0)
Neutro Abs: 6.4 10*3/uL (ref 1.7–7.7)
Neutrophils Relative %: 60 % (ref 43–77)
Platelets: 211 10*3/uL (ref 150–400)
RBC: 4.37 MIL/uL (ref 3.87–5.11)
RDW: 12.8 % (ref 11.5–15.5)
WBC: 10.6 10*3/uL — ABNORMAL HIGH (ref 4.0–10.5)

## 2013-11-07 LAB — CREATININE, SERUM
CREATININE: 0.61 mg/dL (ref 0.50–1.10)
GFR calc Af Amer: >90 mL/min (ref >90)

## 2013-11-07 LAB — BUN: BUN: 11 mg/dL (ref 6–23)

## 2013-11-07 LAB — HCG, QUANTITATIVE, PREGNANCY: hCG, Beta Chain, Quant, S: 144 m[IU]/mL — ABNORMAL HIGH (ref <5)

## 2013-11-07 LAB — AST: AST: 16 U/L (ref 0–37)

## 2013-11-07 MED ORDER — METHOTREXATE INJECTION FOR WOMEN'S HOSPITAL
50.0000 mg/m2 | Freq: Once | INTRAMUSCULAR | Status: AC
  Administered 2013-11-07: 105 mg via INTRAMUSCULAR
  Filled 2013-11-07: qty 2.1

## 2013-11-07 NOTE — Telephone Encounter (Signed)
Pt informed with the below note. 

## 2013-11-07 NOTE — Telephone Encounter (Signed)
Just would like to clarify pt was seen yesterday for ectopic pregnancy told to return to maternity admission at [facility] have methotrexate injection. Pt thought it was to be done today? Per note it said Sunday is this correct? And have orders been placed for methotrexate injection to be given at [facility]? Please advise

## 2013-11-07 NOTE — Telephone Encounter (Signed)
They are waiting for her maternity admission to give her the methotrexate and additional blood work today before the injection today. She then will return on Sunday just for the quantitative beta-hCG to maternity admission and then on Wednesday [facility] . Please have her go to day!!!!!!!!

## 2013-11-07 NOTE — Progress Notes (Signed)
No adverse effects to MTX. Discussed at length, F/U labs, importance of F/U. Discussed MTX precautions, ectopic precautions. To return 8/1 to MAU for repeat BHCG, then to office 8/5 per order of Dr. Lily PeerFernandez.

## 2013-11-10 ENCOUNTER — Inpatient Hospital Stay (HOSPITAL_COMMUNITY)
Admission: AD | Admit: 2013-11-10 | Discharge: 2013-11-10 | Disposition: A | Discharge status: Home / Self Care | Payer: Commercial Managed Care - PPO | Source: Ambulatory Visit | Attending: Gynecology | Admitting: Gynecology

## 2013-11-10 DIAGNOSIS — O00209 Unspecified ovarian pregnancy without intrauterine pregnancy: Secondary | ICD-10-CM

## 2013-11-10 DIAGNOSIS — R41840 Attention and concentration deficit: Secondary | ICD-10-CM | POA: Insufficient documentation

## 2013-11-10 DIAGNOSIS — I059 Rheumatic mitral valve disease, unspecified: Secondary | ICD-10-CM | POA: Insufficient documentation

## 2013-11-10 DIAGNOSIS — F329 Major depressive disorder, single episode, unspecified: Secondary | ICD-10-CM | POA: Diagnosis not present

## 2013-11-10 DIAGNOSIS — F3289 Other specified depressive episodes: Secondary | ICD-10-CM | POA: Diagnosis not present

## 2013-11-10 DIAGNOSIS — F411 Generalized anxiety disorder: Secondary | ICD-10-CM | POA: Insufficient documentation

## 2013-11-10 LAB — HCG, QUANTITATIVE, PREGNANCY: HCG, BETA CHAIN, QUANT, S: 115 m[IU]/mL — AB (ref <5)

## 2013-11-10 NOTE — Discharge Instructions (Signed)
Methotrexate Treatment for an Ectopic Pregnancy °Methotrexate is a medicine that treats ectopic pregnancy by stopping the growth of the fertilized egg. It also helps your body absorb tissue from the egg. This takes between 2 weeks and 6 weeks. Most ectopic pregnancies can be successfully treated with methotrexate if they are detected early enough. °LET YOUR HEALTH CARE PROVIDER KNOW ABOUT: °· Any allergies you have. °· All medicines you are taking, including vitamins, herbs, eye drops, creams, and over-the-counter medicines. °· Medical conditions you have. °RISKS AND COMPLICATIONS °Generally, this is a safe treatment. However, as with any treatment, problems can occur. Possible problems or side effects include: °· Nausea. °· Vomiting. °· Diarrhea. °· Abdominal cramping. °· Mouth sores. °· Increased vaginal bleeding or spotting.   °· Swelling or irritation of the lining of your lungs (pneumonitis).  °· Failed treatment and continuation of the pregnancy.   °· Liver damage. °· Hair loss. °There is still a risk of the ectopic pregnancy rupturing while using the methotrexate. °BEFORE THE PROCEDURE °Before you take the medicine:  °· Liver tests, kidney tests, and a complete blood test are performed. °· Blood tests are performed to measure the pregnancy hormone levels and to determine your blood type. °· If you are Rh-negative and the father is Rh-positive or his Rh type is not known, you will be given a Rho (D) immune globulin shot. °PROCEDURE  °There are two methods that your health care provider may use to prescribe methotrexate. One method involves a single dose or injection of the medicine. Another method involves a series of doses given through several injections.  °AFTER THE PROCEDURE °· You may have some abdominal cramping, vaginal bleeding, and fatigue in the first few days after taking methotrexate. °· Blood tests will be taken for several weeks to check the pregnancy hormone levels. The blood tests are performed  until there is no more pregnancy hormone detected in the blood. °Document Released: 03/23/2001 Document Revised: 08/13/2013 Document Reviewed: 01/15/2013 °[employer]® Patient Information ©2015 [employer]. This information is not intended to replace advice given to you by your health care provider. Make sure you discuss any questions you have with your health care provider. ° °

## 2013-11-10 NOTE — MAU Note (Signed)
Pt in MAU for repeat BHCG. Denies pain and vaginal bleeding

## 2013-11-10 NOTE — MAU Provider Note (Signed)
CC: No chief complaint on file.   \HPI Tara Lucero is a 28 y.o. G1P0000 who presents for quantitative beta hCG on day 4 status post methotrexate for left ovarian ectopic. LMP 09/14/2013. Denies any bleeding or abdominal pain.  She has been followed since 10/20/2013 with abnormal rises in quantitative beta-hCG as follows:   Results for Tara Lucero, Tara Lucero (MRN [id number]) as of 11/10/2013 17:46  Ref. Range 10/20/2013 14:07 10/22/2013 09:20 10/26/2013 08:38 11/02/2013 08:45 11/06/2013 08:50 11/07/2013 15:58 11/10/2013 16:35  hCG, Beta Chain, Quant, S Latest Range: <5 mIU/mL 74 (H) 97.03 113.5 185.5 179.6 144 (H) 115 (H)  POC is to have D7 quant at office.   Past Medical History  Diagnosis Date  . Anxiety   . ADD (attention deficit disorder)   . Depression   . Mitral valve prolapse       Past Surgical History  Procedure Laterality Date  . Wisdom tooth extraction        No current facility-administered medications on file prior to encounter.   Current Outpatient Prescriptions on File Prior to Encounter  Medication Sig Dispense Refill  . acetaminophen (TYLENOL) 500 MG tablet Take 1,000 mg by mouth every 6 (six) hours as needed for mild pain.      . citalopram (CELEXA) 40 MG tablet Take 40 mg by mouth daily.      . Omega-3 Fatty Acids (FISH OIL PO) Take 2 capsules by mouth daily.      . Prenatal Vit-Fe Fumarate-FA (PRENATAL MULTIVITAMIN) TABS tablet Take 1 tablet by mouth daily at 12 noon.        No Known Allergies  ROS Pertinent items in HPI  PHYSICAL EXAM Filed Vitals:   11/10/13 1640  BP: 116/68  Pulse: 93   General: Well nourished, well developed female in no acute distress Abdomen: Soft, nontender  LAB RESULTS Results for orders placed during the hospital encounter of 11/10/13 (from the past 24 hour(s))  HCG, QUANTITATIVE, PREGNANCY     Status: Abnormal   Collection Time    11/10/13  4:35 PM      Result Value Ref Range   hCG, Beta Chain, Quant, S 115 (*) <5 mIU/mL     IMAGING Office scan 11/07/13 showed 1mm solid focus with blood flow at wall of left ovary, no IUP or FF  MAU COURSE   ASSESSMENT  1. Ectopic pregnancy of ovary   Day 4 s/p MTX, stable  PLAN Discharge home. See AVS for patient education. Follow-up Information   Follow up with Bennye Alm, MD On 11/14/2013. (Keep your scheduled appointment)    Specialty:  Obstetrics and Gynecology   Contact information:   [address] [place] [address] [phone]        Medication List         acetaminophen 500 MG tablet  Commonly known as:  TYLENOL  Take 1,000 mg by mouth every 6 (six) hours as needed for mild pain.     citalopram 40 MG tablet  Commonly known as:  CELEXA  Take 40 mg by mouth daily.     FISH OIL PO  Take 2 capsules by mouth daily.     prenatal multivitamin Tabs tablet  Take 1 tablet by mouth daily at 12 noon.       Follow-up Information   Follow up with Bennye Alm, MD On 11/14/2013. (Keep your scheduled appointment)    Specialty:  Obstetrics and Gynecology   Contact information:   [address]  [address] [place] [id number] [phone]         Danae OrleansDeirdre C Nino Amano, CNM 11/10/2013 6:04 PM

## 2013-11-13 ENCOUNTER — Other Ambulatory Visit: Payer: Self-pay | Admitting: Gynecology

## 2013-11-13 DIAGNOSIS — O021 Missed abortion: Secondary | ICD-10-CM

## 2013-11-14 ENCOUNTER — Other Ambulatory Visit: Payer: Commercial Managed Care - PPO

## 2013-11-14 ENCOUNTER — Other Ambulatory Visit: Payer: Self-pay | Admitting: Women's Health

## 2013-11-14 DIAGNOSIS — O021 Missed abortion: Secondary | ICD-10-CM

## 2013-11-14 DIAGNOSIS — O009 Unspecified ectopic pregnancy without intrauterine pregnancy: Secondary | ICD-10-CM

## 2013-11-14 LAB — HCG, QUANTITATIVE, PREGNANCY: hCG, Beta Chain, Quant, S: 69.13 m[IU]/mL

## 2013-11-14 NOTE — Progress Notes (Signed)
Telephone call to review HCG Quant down to 69, 11/07/2013 Quant 144, 11/06/2013 Quant 179 received methotrexate 11/06/2013 . Reviewed importance of weekly quantitative hCGs until less than 5, barrier contraception for at least 3 months. Denies pain, bleeding

## 2013-11-21 ENCOUNTER — Telehealth: Payer: Self-pay | Admitting: Women's Health

## 2013-11-21 NOTE — Telephone Encounter (Signed)
Left message on cell to call office to schedule appointment for quantitative hCG

## 2013-11-23 ENCOUNTER — Other Ambulatory Visit: Payer: Commercial Managed Care - PPO

## 2013-11-23 ENCOUNTER — Other Ambulatory Visit: Payer: Self-pay | Admitting: Women's Health

## 2013-11-23 DIAGNOSIS — O009 Unspecified ectopic pregnancy without intrauterine pregnancy: Secondary | ICD-10-CM

## 2013-11-23 LAB — HCG, QUANTITATIVE, PREGNANCY: hCG, Beta Chain, Quant, S: 9.39 m[IU]/mL

## 2013-11-29 ENCOUNTER — Other Ambulatory Visit: Payer: Commercial Managed Care - PPO

## 2013-11-29 DIAGNOSIS — O009 Unspecified ectopic pregnancy without intrauterine pregnancy: Secondary | ICD-10-CM

## 2013-11-29 LAB — HCG, QUANTITATIVE, PREGNANCY: hCG, Beta Chain, Quant, S: <2 m[IU]/mL

## 2014-02-11 ENCOUNTER — Encounter (HOSPITAL_COMMUNITY): Payer: Self-pay | Admitting: *Deleted

## 2014-09-12 ENCOUNTER — Inpatient Hospital Stay (HOSPITAL_COMMUNITY)
Admission: AD | Admit: 2014-09-12 | Payer: Commercial Managed Care - PPO | Source: Ambulatory Visit | Admitting: Obstetrics and Gynecology

## 2014-10-16 ENCOUNTER — Encounter: Payer: Commercial Managed Care - PPO | Attending: Obstetrics and Gynecology

## 2014-10-16 VITALS — Ht 67.0 in | Wt 288.6 lb

## 2014-10-16 DIAGNOSIS — Z713 Dietary counseling and surveillance: Secondary | ICD-10-CM | POA: Insufficient documentation

## 2014-10-16 DIAGNOSIS — O24419 Gestational diabetes mellitus in pregnancy, unspecified control: Secondary | ICD-10-CM | POA: Diagnosis not present

## 2014-10-22 NOTE — Progress Notes (Signed)
  Patient was seen on 10/16/14 for Gestational Diabetes self-management . The following learning objectives were met by the patient :   States the definition of Gestational Diabetes  States why dietary management is important in controlling blood glucose  Describes the effects of carbohydrates on blood glucose levels  Demonstrates ability to create a balanced meal plan  Demonstrates carbohydrate counting   States when to check blood glucose levels  Demonstrates proper blood glucose monitoring techniques  States the effect of stress and exercise on blood glucose levels  States the importance of limiting caffeine and abstaining from alcohol and smoking  Plan:  Aim for 2 Carb Choices per meal (30 grams) +/- 1 either way for breakfast Aim for 3 Carb Choices per meal (45 grams) +/- 1 either way from lunch and dinner Aim for 1-2 Carbs per snack Begin reading food labels for Total Carbohydrate and sugar grams of foods Consider  increasing your activity level by walking daily as tolerated Begin checking BG before breakfast and 1 hours after first bit of breakfast, lunch and dinner after  as directed by MD  Take medication  as directed by MD  Blood glucose monitor given: one touch Verio Flex Lot # [id number] X Exp: 12/2015 Blood glucose reading: $RemoveBeforeDE'102mg'ZRCrjeuFGsGePcd$ /dl  Patient instructed to monitor glucose levels: FBS: 60 - <90 1 hour: <140  Patient received the following handouts:  Nutrition Diabetes and Pregnancy  Carbohydrate Counting List  Meal Planning worksheet  Patient will be seen for follow-up as needed.

## 2014-11-18 ENCOUNTER — Inpatient Hospital Stay (HOSPITAL_COMMUNITY)
Admission: AD | Admit: 2014-11-18 | Discharge: 2014-11-21 | DRG: 774 | Disposition: A | Discharge status: Home / Self Care | Payer: Commercial Managed Care - PPO | Source: Ambulatory Visit | Attending: Obstetrics and Gynecology | Admitting: Obstetrics and Gynecology

## 2014-11-18 ENCOUNTER — Encounter (HOSPITAL_COMMUNITY): Payer: Self-pay | Admitting: *Deleted

## 2014-11-18 DIAGNOSIS — O99214 Obesity complicating childbirth: Secondary | ICD-10-CM | POA: Diagnosis present

## 2014-11-18 DIAGNOSIS — O9081 Anemia of the puerperium: Secondary | ICD-10-CM | POA: Diagnosis not present

## 2014-11-18 DIAGNOSIS — Z87891 Personal history of nicotine dependence: Secondary | ICD-10-CM

## 2014-11-18 DIAGNOSIS — O139 Gestational [pregnancy-induced] hypertension without significant proteinuria, unspecified trimester: Secondary | ICD-10-CM | POA: Diagnosis present

## 2014-11-18 DIAGNOSIS — D649 Anemia, unspecified: Secondary | ICD-10-CM | POA: Diagnosis not present

## 2014-11-18 DIAGNOSIS — O9942 Diseases of the circulatory system complicating childbirth: Secondary | ICD-10-CM | POA: Diagnosis present

## 2014-11-18 DIAGNOSIS — O99824 Streptococcus B carrier state complicating childbirth: Secondary | ICD-10-CM | POA: Diagnosis present

## 2014-11-18 DIAGNOSIS — O133 Gestational [pregnancy-induced] hypertension without significant proteinuria, third trimester: Secondary | ICD-10-CM | POA: Diagnosis present

## 2014-11-18 DIAGNOSIS — O4292 Full-term premature rupture of membranes, unspecified as to length of time between rupture and onset of labor: Secondary | ICD-10-CM | POA: Diagnosis present

## 2014-11-18 DIAGNOSIS — O24420 Gestational diabetes mellitus in childbirth, diet controlled: Secondary | ICD-10-CM | POA: Diagnosis present

## 2014-11-18 DIAGNOSIS — Z3A39 39 weeks gestation of pregnancy: Secondary | ICD-10-CM | POA: Diagnosis present

## 2014-11-18 DIAGNOSIS — I341 Nonrheumatic mitral (valve) prolapse: Secondary | ICD-10-CM | POA: Diagnosis present

## 2014-11-18 DIAGNOSIS — Z825 Family history of asthma and other chronic lower respiratory diseases: Secondary | ICD-10-CM

## 2014-11-18 DIAGNOSIS — Z8249 Family history of ischemic heart disease and other diseases of the circulatory system: Secondary | ICD-10-CM

## 2014-11-18 DIAGNOSIS — Z6841 Body Mass Index (BMI) 40.0 and over, adult: Secondary | ICD-10-CM | POA: Diagnosis not present

## 2014-11-18 LAB — COMPREHENSIVE METABOLIC PANEL
ALT: 12 U/L — ABNORMAL LOW (ref 14–54)
AST: 21 U/L (ref 15–41)
Albumin: 2.5 g/dL — ABNORMAL LOW (ref 3.5–5.0)
Alkaline Phosphatase: 104 U/L (ref 38–126)
Anion gap: 10 (ref 5–15)
BUN: 12 mg/dL (ref 6–20)
CALCIUM: 8.9 mg/dL (ref 8.9–10.3)
CO2: 17 mmol/L — ABNORMAL LOW (ref 22–32)
CREATININE: 0.51 mg/dL (ref 0.44–1.00)
Chloride: 107 mmol/L (ref 101–111)
GFR calc Af Amer: >60 mL/min (ref >60)
GFR calc non Af Amer: >60 mL/min (ref >60)
GLUCOSE: 108 mg/dL — AB (ref 65–99)
POTASSIUM: 4.3 mmol/L (ref 3.5–5.1)
Sodium: 134 mmol/L — ABNORMAL LOW (ref 135–145)
Total Bilirubin: 0.4 mg/dL (ref 0.3–1.2)
Total Protein: 5.8 g/dL — ABNORMAL LOW (ref 6.5–8.1)

## 2014-11-18 LAB — GLUCOSE, CAPILLARY
GLUCOSE-CAPILLARY: 92 mg/dL (ref 65–99)
Glucose-Capillary: 118 mg/dL — ABNORMAL HIGH (ref 65–99)
Glucose-Capillary: 85 mg/dL (ref 65–99)
Glucose-Capillary: 79 mg/dL (ref 65–99)

## 2014-11-18 LAB — CBC
HCT: 37.3 % (ref 36.0–46.0)
Hemoglobin: 11.9 g/dL — ABNORMAL LOW (ref 12.0–15.0)
MCH: 26.5 pg (ref 26.0–34.0)
MCHC: 31.9 g/dL (ref 30.0–36.0)
MCV: 83.1 fL (ref 78.0–100.0)
PLATELETS: 216 10*3/uL (ref 150–400)
RBC: 4.49 MIL/uL (ref 3.87–5.11)
RDW: 14.9 % (ref 11.5–15.5)
WBC: 13.7 10*3/uL — AB (ref 4.0–10.5)

## 2014-11-18 LAB — OB RESULTS CONSOLE RPR: RPR: NONREACTIVE

## 2014-11-18 LAB — OB RESULTS CONSOLE GC/CHLAMYDIA
CHLAMYDIA, DNA PROBE: NEGATIVE
Gonorrhea: NEGATIVE

## 2014-11-18 LAB — OB RESULTS CONSOLE HEPATITIS B SURFACE ANTIGEN: Hepatitis B Surface Ag: NEGATIVE

## 2014-11-18 LAB — TYPE AND SCREEN
ABO/RH(D): A POS
Antibody Screen: NEGATIVE

## 2014-11-18 LAB — OB RESULTS CONSOLE GBS: GBS: POSITIVE

## 2014-11-18 LAB — OB RESULTS CONSOLE RUBELLA ANTIBODY, IGM: RUBELLA: IMMUNE

## 2014-11-18 LAB — OB RESULTS CONSOLE HIV ANTIBODY (ROUTINE TESTING): HIV: NONREACTIVE

## 2014-11-18 LAB — OB RESULTS CONSOLE ANTIBODY SCREEN: Antibody Screen: NEGATIVE

## 2014-11-18 MED ORDER — EPHEDRINE 5 MG/ML INJ
10.0000 mg | INTRAVENOUS | Status: DC | PRN
  Filled 2014-11-18: qty 2

## 2014-11-18 MED ORDER — FENTANYL 2.5 MCG/ML BUPIVACAINE 1/10 % EPIDURAL INFUSION (WH - ANES)
14.0000 mL/h | INTRAMUSCULAR | Status: DC | PRN
  Administered 2014-11-19 (×5): 14 mL/h via EPIDURAL
  Filled 2014-11-18 (×4): qty 125

## 2014-11-18 MED ORDER — FENTANYL 2.5 MCG/ML BUPIVACAINE 1/10 % EPIDURAL INFUSION (WH - ANES): 14.0000 mL/h | INTRAMUSCULAR | Status: DC | PRN

## 2014-11-18 MED ORDER — BUTORPHANOL TARTRATE 1 MG/ML IJ SOLN
1.0000 mg | INTRAMUSCULAR | Status: DC | PRN
Start: 2014-11-18 — End: 2014-11-20
  Administered 2014-11-18: 1 mg via INTRAVENOUS
  Filled 2014-11-18: qty 1

## 2014-11-18 MED ORDER — DIPHENHYDRAMINE HCL 50 MG/ML IJ SOLN: 12.5000 mg | INTRAMUSCULAR | Status: DC | PRN

## 2014-11-18 MED ORDER — ACETAMINOPHEN 325 MG PO TABS
650.0000 mg | ORAL_TABLET | ORAL | Status: DC | PRN
  Administered 2014-11-18 – 2014-11-19 (×4): 650 mg via ORAL
  Filled 2014-11-18 (×5): qty 2

## 2014-11-18 MED ORDER — PHENYLEPHRINE 40 MCG/ML (10ML) SYRINGE FOR IV PUSH (FOR BLOOD PRESSURE SUPPORT)
80.0000 ug | PREFILLED_SYRINGE | INTRAVENOUS | Status: DC | PRN
Start: 2014-11-18 — End: 2014-11-20
  Administered 2014-11-19: 80 ug via INTRAVENOUS
  Filled 2014-11-18: qty 20
  Filled 2014-11-18: qty 2

## 2014-11-18 MED ORDER — OXYCODONE-ACETAMINOPHEN 5-325 MG PO TABS: 1.0000 | ORAL_TABLET | ORAL | Status: DC | PRN

## 2014-11-18 MED ORDER — TERBUTALINE SULFATE 1 MG/ML IJ SOLN
0.2500 mg | Freq: Once | INTRAMUSCULAR | Status: DC | PRN
  Filled 2014-11-18: qty 1

## 2014-11-18 MED ORDER — PHENYLEPHRINE 40 MCG/ML (10ML) SYRINGE FOR IV PUSH (FOR BLOOD PRESSURE SUPPORT)
80.0000 ug | PREFILLED_SYRINGE | INTRAVENOUS | Status: DC | PRN
Start: 2014-11-18 — End: 2014-11-18

## 2014-11-18 MED ORDER — LIDOCAINE HCL (PF) 1 % IJ SOLN
30.0000 mL | INTRAMUSCULAR | Status: DC | PRN
  Administered 2014-11-19: 30 mL via SUBCUTANEOUS
  Filled 2014-11-18: qty 30

## 2014-11-18 MED ORDER — EPHEDRINE 5 MG/ML INJ: 10.0000 mg | INTRAVENOUS | Status: DC | PRN

## 2014-11-18 MED ORDER — OXYCODONE-ACETAMINOPHEN 5-325 MG PO TABS: 2.0000 | ORAL_TABLET | ORAL | Status: DC | PRN

## 2014-11-18 MED ORDER — OXYTOCIN BOLUS FROM INFUSION: 500.0000 mL | INTRAVENOUS | Status: DC

## 2014-11-18 MED ORDER — PENICILLIN G POTASSIUM 5000000 UNITS IJ SOLR
5.0000 10*6.[IU] | Freq: Once | INTRAVENOUS | Status: AC
  Administered 2014-11-18: 5 10*6.[IU] via INTRAVENOUS
  Filled 2014-11-18: qty 5

## 2014-11-18 MED ORDER — LACTATED RINGERS IV SOLN
INTRAVENOUS | Status: DC
  Administered 2014-11-18 – 2014-11-19 (×4): via INTRAVENOUS

## 2014-11-18 MED ORDER — MISOPROSTOL 50MCG HALF TABLET
50.0000 ug | ORAL_TABLET | Freq: Four times a day (QID) | ORAL | Status: AC
  Administered 2014-11-18: 50 ug via ORAL
  Filled 2014-11-18 (×2): qty 0.5

## 2014-11-18 MED ORDER — LACTATED RINGERS IV SOLN
500.0000 mL | INTRAVENOUS | Status: DC | PRN
  Administered 2014-11-19: 250 mL via INTRAVENOUS

## 2014-11-18 MED ORDER — CITRIC ACID-SODIUM CITRATE 334-500 MG/5ML PO SOLN
30.0000 mL | ORAL | Status: DC | PRN
Start: 2014-11-18 — End: 2014-11-20

## 2014-11-18 MED ORDER — PENICILLIN G POTASSIUM 5000000 UNITS IJ SOLR
2.5000 10*6.[IU] | INTRAVENOUS | Status: DC
  Administered 2014-11-18 – 2014-11-19 (×7): 2.5 10*6.[IU] via INTRAVENOUS
  Filled 2014-11-18 (×12): qty 2.5

## 2014-11-18 MED ORDER — OXYTOCIN 40 UNITS IN LACTATED RINGERS INFUSION - SIMPLE MED
1.0000 m[IU]/min | INTRAVENOUS | Status: DC
Start: 2014-11-18 — End: 2014-11-20
  Administered 2014-11-18: 2 m[IU]/min via INTRAVENOUS
  Administered 2014-11-19: 20 m[IU]/min via INTRAVENOUS
  Filled 2014-11-18: qty 1000

## 2014-11-18 MED ORDER — ONDANSETRON HCL 4 MG/2ML IJ SOLN: 4.0000 mg | Freq: Four times a day (QID) | INTRAMUSCULAR | Status: DC | PRN

## 2014-11-18 MED ORDER — OXYTOCIN 40 UNITS IN LACTATED RINGERS INFUSION - SIMPLE MED
62.5000 mL/h | INTRAVENOUS | Status: DC
  Filled 2014-11-18: qty 1000

## 2014-11-18 NOTE — Progress Notes (Signed)
Patient ID: Tara Lucero, female   DOB: 09-Jun-1985, 29 y.o.   MRN: [id number]  No c/o's x HA  AFVSS gen NAD FHTs 130's, good var, category 1 toco Q  AROM of clear fluid from forebag, w/o diff/comp  SVE 3.4/80/0  Continue IOL

## 2014-11-18 NOTE — Progress Notes (Signed)
Patient ID: Tara Lucero, female   DOB: Mar 09, 1986, 29 y.o.   MRN: [id number]  No c/o's  AFVSS gen NAD FHTs 150's, good var, category 1 toco q 4 min  SVE 2/90/-1  AROM for clear fluid, w/o diff/comp  Continue IOL

## 2014-11-18 NOTE — H&P (Signed)
Tara Lucero is a 29 y.o. female  G2 P0 at 39+ with PIH, some proteinuria, no PIH sx's.  With sever itching of hands and feet.  Relatively uncomplicated PNC, pt gained 75# with pregnancy.  Also Gestational Diabetes, diet controlled.  Date by LMP cwd of first trimester [place].     Maternal Medical History:  Contractions: Frequency: irregular.   Perceived severity is mild.    Fetal activity: Perceived fetal activity is normal.    Prenatal complications: PIH.   Prenatal Complications - Diabetes: gestational. Diabetes is managed by diet.      OB History    Gravida Para Term Preterm AB TAB SAB Ectopic Multiple Living   1    0    1     G1 present H/o abn pap, f/u WNL No STDs  Past Medical History  Diagnosis Date  . Anxiety   . ADD (attention deficit disorder)   . Depression   . Mitral valve prolapse   . Gestational diabetes mellitus, antepartum    Past Surgical History  Procedure Laterality Date  . Wisdom tooth extraction     Family History: family history includes COPD in her maternal grandfather. HTN, obesity Social History:  reports that she quit smoking about 3 years ago. She has never used smokeless tobacco. She reports that she does not drink alcohol or use illicit drugs. respiratory therapist, married Meds Celexa 40 and PNV All NKDA   Prenatal Transfer Tool  Maternal Diabetes: Yes:  Diabetes Type:  Diet controlled Genetic Screening: Normal Maternal Ultrasounds/Referrals: Normal Fetal Ultrasounds or other Referrals:  None Maternal Substance Abuse:  No Significant Maternal Medications:  None Significant Maternal Lab Results:  Lab values include: Group B Strep positive Other Comments:  None  Review of Systems  Constitutional: Negative.   HENT: Negative.   Eyes: Negative.   Respiratory: Negative.   Cardiovascular: Negative.   Gastrointestinal: Negative.   Genitourinary: Negative.   Musculoskeletal: Negative.   Skin: Positive for itching.  Neurological: Negative.    Psychiatric/Behavioral: Negative.       unknown if currently breastfeeding. Maternal Exam:  Uterine Assessment: Contraction frequency is irregular.   Abdomen: Patient reports no abdominal tenderness. Fundal height is appropriate for gestation.   Fetal presentation: vertex  Introitus: Normal vulva. Normal vagina.  Cervix: Cervix evaluated by digital exam.     Physical Exam  Constitutional: She is oriented to person, place, and time. She appears well-developed and well-nourished.  Obese  HENT:  Head: Normocephalic and atraumatic.  Cardiovascular: Normal rate and regular rhythm.   Respiratory: Effort normal and breath sounds normal. No respiratory distress. She has no wheezes.  GI: Soft. Bowel sounds are normal. She exhibits no distension. There is no tenderness.  Musculoskeletal: Normal range of motion.  Neurological: She is alert and oriented to person, place, and time.  Skin: Skin is warm and dry.  Psychiatric: She has a normal mood and affect. Her behavior is normal.    Prenatal labs: ABO, Rh:  A+ Antibody: Negative (08/08 1320) Rubella: Immune (08/08 1320) RPR: Nonreactive (08/08 1320)  HBsAg: Negative (08/08 1320)  HIV: Non-reactive (08/08 1320)  GBS: Positive (08/08 1320)   Hgb 13.0/Plt 277/Ur Cx neg/Chl neg/ GC neg/glucola 157 - 3hr diagnoses GDM  Nl anat, post plac, female Tdap 09/05/14  Assessment/Plan: 28yo G1P0 at 39+ with PIH for IOL cytotec for cervical ripening, then pitocin AROM after PCN PCN for GBBS prophylaxis Epidural or IV pain meds prn Expect SVD   Bovard-Stuckert, Chamille Werntz  11/18/2014, 1:29 PM

## 2014-11-19 ENCOUNTER — Inpatient Hospital Stay (HOSPITAL_COMMUNITY): Payer: Commercial Managed Care - PPO | Admitting: Anesthesiology

## 2014-11-19 LAB — CBC
HEMATOCRIT: 35.2 % — AB (ref 36.0–46.0)
Hemoglobin: 11.1 g/dL — ABNORMAL LOW (ref 12.0–15.0)
MCH: 25.9 pg — AB (ref 26.0–34.0)
MCHC: 31.5 g/dL (ref 30.0–36.0)
MCV: 82.1 fL (ref 78.0–100.0)
Platelets: 197 10*3/uL (ref 150–400)
RBC: 4.29 MIL/uL (ref 3.87–5.11)
RDW: 14.9 % (ref 11.5–15.5)
WBC: 13.9 10*3/uL — ABNORMAL HIGH (ref 4.0–10.5)

## 2014-11-19 LAB — GLUCOSE, CAPILLARY
GLUCOSE-CAPILLARY: 107 mg/dL — AB (ref 65–99)
Glucose-Capillary: 99 mg/dL (ref 65–99)
Glucose-Capillary: 80 mg/dL (ref 65–99)
Glucose-Capillary: 70 mg/dL (ref 65–99)

## 2014-11-19 LAB — RPR: RPR Ser Ql: NONREACTIVE

## 2014-11-19 MED ORDER — LIDOCAINE HCL (PF) 1 % IJ SOLN
INTRAMUSCULAR | Status: DC | PRN
  Administered 2014-11-19 (×2): 8 mL via EPIDURAL

## 2014-11-19 NOTE — Anesthesia Procedure Notes (Signed)
Epidural Patient location during procedure: OB Start time: 11/19/2014 12:30 AM End time: 11/19/2014 12:34 AM  Staffing Anesthesiologist: Leilani Able Performed by: anesthesiologist   Preanesthetic Checklist Completed: patient identified, surgical consent, pre-op evaluation, timeout performed, IV checked, risks and benefits discussed and monitors and equipment checked  Epidural Patient position: sitting Prep: site prepped and draped and DuraPrep Patient monitoring: continuous pulse ox and blood pressure Approach: midline Location: L3-L4 Injection technique: LOR air  Needle:  Needle type: Tuohy  Needle gauge: 17 G Needle length: 9 cm and 9 Needle insertion depth: 6 cm Catheter type: closed end flexible Catheter size: 19 Gauge Catheter at skin depth: 11 cm Test dose: negative and Other  Assessment Sensory level: T9 Events: blood not aspirated, injection not painful, no injection resistance, negative IV test and no paresthesia  Additional Notes Reason for block:procedure for pain

## 2014-11-19 NOTE — Progress Notes (Signed)
Patient ID: Tara Lucero, female   DOB: 01/14/1986, 29 y.o.   MRN: [id number] The pt has been pushing for 2 hours and 10 minutes. She does not seem to be able to maintain a valsalva more than 4-5 seconds so the descent has been slow. With pushing some of the caput is visible. I have advised her we will consider operative delivery at 3 hours.

## 2014-11-19 NOTE — Progress Notes (Signed)
Patient ID: Tara Lucero, female   DOB: 04/23/1985, 29 y.o.   MRN: [id number] Pt was examined 1 hour ago by the RN and she was thought to be 7 cm at - 1 station. FHR is normal and contractions are q 2-3 minutes.

## 2014-11-19 NOTE — Progress Notes (Signed)
Patient ID: Tara Lucero, female   DOB: 1985/05/29, 29 y.o.   MRN: [id number] At 8PM the cervix is fully dilated The vertex feels ROT Will begin pushing. The vertex is at + 1 station.

## 2014-11-19 NOTE — Progress Notes (Signed)
Patient ID: Tara Lucero, female   DOB: [birth year], 29 y.o.   MRN: [id number] I have assumed her care from Dr. Ellyn Hack. Her pitocin is at 22 mu/minute and the contractions are q 2-4 minutes. The FHR is normal. The cervix is 5 cm 80-90% effaced and the vertex is at - 1 station There is some bloody show

## 2014-11-19 NOTE — Progress Notes (Signed)
Patient ID: Tara Lucero, female   DOB: 21-Sep-1985, 29 y.o.   MRN: [id number] Delivery note:  The pt continued to push well and delivered a living female infant spontaneously ROA over a second degree ML laceration. Apgars were 9 and 9 at 1 and 5 minutes. The placenta was intact and the uterus was normal except boggy. I massaged it and gave pitocin. The ML laceration was repaired with 3-0 vicryl under local block. Ebl 246 cc's.

## 2014-11-19 NOTE — Progress Notes (Signed)
Patient ID: GAYL IVANOFF, female   DOB: 1985/09/04, 29 y.o.   MRN: [id number] Pitocin is at 30 mu/minute and the contractions are q 3 minutes. The cervix is 9+ cm dilated and the vertex is at + 1 station. Will check again in 1 hour and see if the cervix is gone. If not, I may have her push and see if the remaining cervix will disappear.

## 2014-11-19 NOTE — Progress Notes (Signed)
Patient ID: Tara Lucero, female   DOB: 01-02-1986, 29 y.o.   MRN: [id number]  Comfortable with [place]  AFVSS gen NAD FHTs 140's good var, category 1 toco Q 2-4 min  SVE 3.4/70/-2 IUPC placed w/o diff/comp  Continue IOL

## 2014-11-19 NOTE — Anesthesia Preprocedure Evaluation (Signed)
Anesthesia Evaluation  Patient identified by MRN, date of birth, ID band Patient awake    Reviewed: Allergy & Precautions, H&P , Patient's Chart, lab work & pertinent test results  Airway Mallampati: III  TM Distance: >3 FB Neck ROM: full    Dental no notable dental hx.    Pulmonary former smoker,    Pulmonary exam normal       Cardiovascular Normal cardiovascular exam    Neuro/Psych negative neurological ROS     GI/Hepatic negative GI ROS, Neg liver ROS,   Endo/Other  diabetes, GestationalMorbid obesity  Renal/GU negative Renal ROS     Musculoskeletal   Abdominal (+) + obese,   Peds  Hematology negative hematology ROS (+)   Anesthesia Other Findings   Reproductive/Obstetrics (+) Pregnancy                             Anesthesia Physical Anesthesia Plan  ASA: III  Anesthesia Plan: Epidural   Post-op Pain Management:    Induction:   Airway Management Planned:   Additional Equipment:   Intra-op Plan:   Post-operative Plan:   Informed Consent: I have reviewed the patients History and Physical, chart, labs and discussed the procedure including the risks, benefits and alternatives for the proposed anesthesia with the patient or authorized representative who has indicated his/her understanding and acceptance.     Plan Discussed with:   Anesthesia Plan Comments:         Anesthesia Quick Evaluation

## 2014-11-20 ENCOUNTER — Encounter (HOSPITAL_COMMUNITY): Payer: Self-pay | Admitting: Emergency Medicine

## 2014-11-20 LAB — CBC
HEMATOCRIT: 31.3 % — AB (ref 36.0–46.0)
HEMATOCRIT: 33.4 % — AB (ref 36.0–46.0)
HEMOGLOBIN: 10.6 g/dL — AB (ref 12.0–15.0)
HEMOGLOBIN: 9.9 g/dL — AB (ref 12.0–15.0)
MCH: 25.8 pg — AB (ref 26.0–34.0)
MCH: 26 pg (ref 26.0–34.0)
MCHC: 31.6 g/dL (ref 30.0–36.0)
MCHC: 31.7 g/dL (ref 30.0–36.0)
MCV: 81.9 fL (ref 78.0–100.0)
MCV: 81.7 fL (ref 78.0–100.0)
Platelets: 176 10*3/uL (ref 150–400)
Platelets: 170 10*3/uL (ref 150–400)
RBC: 3.83 MIL/uL — ABNORMAL LOW (ref 3.87–5.11)
RBC: 4.08 MIL/uL (ref 3.87–5.11)
RDW: 15 % (ref 11.5–15.5)
RDW: 15.1 % (ref 11.5–15.5)
WBC: 21.2 10*3/uL — ABNORMAL HIGH (ref 4.0–10.5)
WBC: 19.9 10*3/uL — ABNORMAL HIGH (ref 4.0–10.5)

## 2014-11-20 LAB — GLUCOSE, CAPILLARY: GLUCOSE-CAPILLARY: 98 mg/dL (ref 65–99)

## 2014-11-20 MED ORDER — CITALOPRAM HYDROBROMIDE 40 MG PO TABS
40.0000 mg | ORAL_TABLET | Freq: Every day | ORAL | Status: DC
  Administered 2014-11-20 – 2014-11-21 (×2): 40 mg via ORAL
  Filled 2014-11-20 (×2): qty 1

## 2014-11-20 MED ORDER — LACTATED RINGERS IV SOLN: INTRAVENOUS | Status: AC

## 2014-11-20 MED ORDER — OXYCODONE-ACETAMINOPHEN 5-325 MG PO TABS
1.0000 | ORAL_TABLET | ORAL | Status: DC | PRN
  Administered 2014-11-20 – 2014-11-21 (×3): 1 via ORAL
  Filled 2014-11-20 (×3): qty 1

## 2014-11-20 MED ORDER — WITCH HAZEL-GLYCERIN EX PADS: 1.0000 "application " | MEDICATED_PAD | CUTANEOUS | Status: DC | PRN

## 2014-11-20 MED ORDER — ONDANSETRON HCL 4 MG/2ML IJ SOLN: 4.0000 mg | INTRAMUSCULAR | Status: DC | PRN

## 2014-11-20 MED ORDER — TETANUS-DIPHTH-ACELL PERTUSSIS 5-2.5-18.5 LF-MCG/0.5 IM SUSP: 0.5000 mL | Freq: Once | INTRAMUSCULAR | Status: DC

## 2014-11-20 MED ORDER — DIBUCAINE 1 % RE OINT: 1.0000 "application " | TOPICAL_OINTMENT | RECTAL | Status: DC | PRN

## 2014-11-20 MED ORDER — DIPHENHYDRAMINE HCL 25 MG PO CAPS: 25.0000 mg | ORAL_CAPSULE | Freq: Four times a day (QID) | ORAL | Status: DC | PRN

## 2014-11-20 MED ORDER — SENNOSIDES-DOCUSATE SODIUM 8.6-50 MG PO TABS
2.0000 | ORAL_TABLET | ORAL | Status: DC
  Administered 2014-11-21: 2 via ORAL
  Filled 2014-11-20: qty 2

## 2014-11-20 MED ORDER — BENZOCAINE-MENTHOL 20-0.5 % EX AERO
1.0000 "application " | INHALATION_SPRAY | CUTANEOUS | Status: DC | PRN
  Administered 2014-11-20: 1 via TOPICAL
  Filled 2014-11-20: qty 56

## 2014-11-20 MED ORDER — OXYCODONE-ACETAMINOPHEN 5-325 MG PO TABS: 2.0000 | ORAL_TABLET | ORAL | Status: DC | PRN

## 2014-11-20 MED ORDER — ACETAMINOPHEN 325 MG PO TABS
650.0000 mg | ORAL_TABLET | ORAL | Status: DC | PRN
  Administered 2014-11-21: 650 mg via ORAL

## 2014-11-20 MED ORDER — LANOLIN HYDROUS EX OINT: TOPICAL_OINTMENT | CUTANEOUS | Status: DC | PRN

## 2014-11-20 MED ORDER — FAMOTIDINE 20 MG PO TABS
20.0000 mg | ORAL_TABLET | Freq: Every day | ORAL | Status: DC
  Administered 2014-11-20 – 2014-11-21 (×2): 20 mg via ORAL
  Filled 2014-11-20 (×2): qty 1

## 2014-11-20 MED ORDER — MEASLES, MUMPS & RUBELLA VAC ~~LOC~~ INJ: 0.5000 mL | INJECTION | Freq: Once | SUBCUTANEOUS | Status: DC

## 2014-11-20 MED ORDER — PRENATAL MULTIVITAMIN CH
1.0000 | ORAL_TABLET | Freq: Every day | ORAL | Status: DC
  Administered 2014-11-20 – 2014-11-21 (×2): 1 via ORAL
  Filled 2014-11-20 (×2): qty 1

## 2014-11-20 MED ORDER — IBUPROFEN 600 MG PO TABS
600.0000 mg | ORAL_TABLET | Freq: Four times a day (QID) | ORAL | Status: DC
  Administered 2014-11-20 – 2014-11-21 (×7): 600 mg via ORAL
  Filled 2014-11-20 (×7): qty 1

## 2014-11-20 MED ORDER — SIMETHICONE 80 MG PO CHEW: 80.0000 mg | CHEWABLE_TABLET | ORAL | Status: DC | PRN

## 2014-11-20 MED ORDER — ZOLPIDEM TARTRATE 5 MG PO TABS: 5.0000 mg | ORAL_TABLET | Freq: Every evening | ORAL | Status: DC | PRN

## 2014-11-20 MED ORDER — ONDANSETRON HCL 4 MG PO TABS: 4.0000 mg | ORAL_TABLET | ORAL | Status: DC | PRN

## 2014-11-20 MED ORDER — OXYTOCIN 40 UNITS IN LACTATED RINGERS INFUSION - SIMPLE MED
62.5000 mL/h | INTRAVENOUS | Status: AC
  Administered 2014-11-20: 62.5 mL/h via INTRAVENOUS
  Filled 2014-11-20: qty 1000

## 2014-11-20 MED ORDER — PRENATAL MULTIVITAMIN CH: 1.0000 | ORAL_TABLET | Freq: Every day | ORAL | Status: DC

## 2014-11-20 NOTE — Anesthesia Postprocedure Evaluation (Signed)
   Anesthesia Post-op Note  Patient: Tara Lucero  Procedure(s) Performed: * No procedures listed *  Patient Location: PACU and Mother/Baby  Anesthesia Type:Epidural  Level of Consciousness: awake, alert  and oriented  Airway and Oxygen Therapy: Patient Spontanous Breathing  Post-op Pain: none  Post-op Assessment: Post-op Vital signs reviewed, Patient's Cardiovascular Status Stable, No headache, No backache, No residual numbness and No residual motor weakness  Post-op Vital Signs: Reviewed and stable  Complications: No apparent anesthesia complications

## 2014-11-20 NOTE — Lactation Note (Signed)
This note was copied from the chart of Tara Aretha Levi. Lactation Consultation Note  Patient Name: Tara Lucero Date: 11/20/2014 Reason for consult: Initial assessment  Baby 15 hours old. Mom reports that baby given formula due to low blood glucose after birth. Baby just back from circumcision and sleepy. Parents concerned baby hasn't been to breast in many hours. Discussed normal newborn behavior post circumcision. Offered to assist with hand expression and spoon-feeding baby but parents declined. Parents states that they will call for assistance as needed after their visitors leave. Mom has personal pump in the room. Enc parents to supplement/spoon-feed now because baby sleepy, and enc at least a teaspoon every 2-3 hours. Enc mom to put baby to breast first when cueing to nurse, then supplement with EBM/formula, and then post pump with her DEBP if baby not willing to nurse. Baby was "spitty" when circumcised, so enc parents to hold baby upright on chest and pat back in between feedings. Mom stated that pediatrician had mentioned baby's oral anatomy--tight lingual frenulum with heart-shaped tongue. Discussed with parents ability to nurse most important factor, and this will be more apparent through the night. Enc parents to discuss with pediatrician in the morning if baby has problems nursing, and/or if mom has nipple pain.  Enc parents to focus on feeding/supplementing baby, and making sure mom's breasts are stimulated with each feeding either by baby at breast for the feeding or with mom's personal DEBP. Mom given [facility] brochure, aware of OP/BFSG, community resources, and [facility] phone line assistance after D/C. Enc parents to call for assistance as needed.  Maternal Data Has patient been taught Hand Expression?: Yes (Per mom.) Does the patient have breastfeeding experience prior to this delivery?: No  Feeding Feeding Type: Breast Fed Length of feed: 0 min (Attempted baby sleepy express breast  milk and finger fed.)  LATCH Score/Interventions                      Lactation Tools Discussed/Used     Consult Status Consult Status: Follow-up Date: 11/21/14 Follow-up type: In-patient    Tara Lucero 11/20/2014, 2:07 PM

## 2014-11-20 NOTE — Progress Notes (Signed)
Dr Senaida Ores notified that pt is requesting to wear her own abdominal binder.  Dr Senaida Ores OK with this

## 2014-11-20 NOTE — Progress Notes (Signed)
Patient ID: Tara Lucero, female   DOB: 10-Jun-1985, 29 y.o.   MRN: [id number] #[id number] afebrile BP normal HGB stable No complaints

## 2014-11-21 MED ORDER — CITALOPRAM HYDROBROMIDE 40 MG PO TABS: 40.0000 mg | ORAL_TABLET | Freq: Every day | ORAL | Status: AC

## 2014-11-21 MED ORDER — OXYCODONE-ACETAMINOPHEN 5-325 MG PO TABS: 1.0000 | ORAL_TABLET | Freq: Four times a day (QID) | ORAL | Status: AC | PRN

## 2014-11-21 MED ORDER — IBUPROFEN 600 MG PO TABS: 600.0000 mg | ORAL_TABLET | Freq: Four times a day (QID) | ORAL | Status: AC | PRN

## 2014-11-21 MED ORDER — FERROUS SULFATE 325 (65 FE) MG PO TABS: 325.0000 mg | ORAL_TABLET | Freq: Two times a day (BID) | ORAL | Status: AC

## 2014-11-21 NOTE — Discharge Instructions (Signed)
booklet °

## 2014-11-21 NOTE — Progress Notes (Signed)
Patient ID: Tara Lucero, female   DOB: March 09, 1986, 29 y.o.   MRN: [id number] #[id number] afebrile BP normal Only complaint is her pelvic bones are painful She has no weakness.

## 2014-11-21 NOTE — Lactation Note (Signed)
This note was copied from the chart of Tara Lucero. Lactation Consultation Note  Patient Name: Tara Lucero [id number] Date: 11/21/2014 Reason for consult: Follow-up assessment   With this mom and term baby, now 64 hours old. Mom is pumping and bottle feeding mostly, and using nipple shield to breast feed. 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 Mom and dad made aware of lactation support groups and o/p lactation, to use as needed. Parents aware to speak to their pediatrician about baby's oral anatomy, and interventions that are available, if they choose. Mom has a personal DEP, and is fine with using nipple shield, and pumping and supplementing for now   Maternal Data    Feeding    LATCH Score/Interventions                      Lactation Tools Discussed/Used     Consult Status Consult Status: Complete Follow-up type: Call as needed    Tara Lucero 11/21/2014, 11:26 AM

## 2014-11-21 NOTE — Discharge Summary (Signed)
Tara Lucero, SIGUENZA NO.:  [id number]  MEDICAL RECORD NO.:  [id number]  LOCATION:  9137                          FACILITY:  [facility]  PHYSICIAN:  Malachi Pro. Ambrose Mantle, M.D. DATE OF BIRTH:  [birth year]  DATE OF ADMISSION:  11/18/2014 DATE OF DISCHARGE:  11/21/2014                              DISCHARGE SUMMARY   HOSPITAL COURSE:  A 29 year old white female, para 0, gravida 2 at 66 plus weeks gestation with pregnancy-induced high blood pressure, some proteinuria, no PIH symptoms, severe itching of her hands and feet.  The patient had gained 75 pounds during the pregnancy, had gestational diabetes, that was diet controlled.  She was admitted for induction of labor.  She underwent cervical ripening with Cytotec.  At 6:09 p.m. on the day of admission, she was 2 cm, 90%, artificial rupture of the membranes produced clear fluid.  At 10:11 p.m., the cervix was 3 to 4 cm, remained 3 to 4 cm at 1:50 a.m., Dr. Ellyn Hack placed an intrauterine pressure catheter.  By 8:30 a.m., I had assumed her care from Dr. Ellyn Hack.  Cervix was 5 cm, 80% to 90%.  At noon, she was thought to be 7 cm by the RN, and at 6:53 p.m., the cervix was 9+ cm.  At 8 p.m., she became fully dilated and pushed well for well over 2 hours.  Finally after close to 3 hours, she was able to deliver spontaneously ROA over a second-degree midline laceration a living female infant, 9 pounds 0 ounces, Apgars of 9 and 9.  Uterus remained somewhat boggy, used Pitocin and vigorous massage, and on the second postpartum day, the patient was ready for discharge.  Initial hemoglobin was 11.1, hematocrit 35.2, white count 13,900, platelet count 197,000.  Followup hemoglobins were 10.6 and 9.9, platelet count 176 and 170.  FINAL DIAGNOSES:  Intrauterine pregnancy at 39 weeks, delivered ROA, gestational diabetes, diet controlled, mild anemia, prolonged rupture of membranes, prolonged second stage of labor.  OPERATION:  Spontaneous  delivery ROA, repair of second-degree midline laceration.  FINAL CONDITION:  Improved.  INSTRUCTIONS:  Include our regular discharge instruction booklet as well as the after visit summary.  Prescriptions for Percocet 5/325, 30 tablets, 1 every 6 hours as needed for pain, Motrin 600 mg 30 tablets, 1 every 6 hours as needed for pain, citalopram 40 mg 30 tablets 1 daily, ferrous sulfate 325 mg #60 one twice a day.  Return to the office in 6 weeks for followup examination.  The patient is advised to consider methods of birth control and if anything requires injection or insertion, to call the office ahead of time so the benefits can be checked and to avoid intercourse to be sure she is not pregnant at the time of insertion.     Malachi Pro. Ambrose Mantle, M.D.     TFH/MEDQ  D:  11/21/2014  T:  11/21/2014  Job:  [id number]

## 2016-04-27 IMAGING — US US OB TRANSVAGINAL
1 series · 14 of 28 positions shown · non-contrast
Comparison: None.

CLINICAL DATA: Right lower quadrant pelvic pain, vaginal bleeding.
Positive pregnancy test.

EXAM:
OBSTETRIC <14 WK US AND TRANSVAGINAL OB US
TECHNIQUE: Both transabdominal and transvaginal ultrasound examinations were
performed for complete evaluation of the gestation as well as the
maternal uterus, adnexal regions, and pelvic cul-de-sac.
Transvaginal technique was performed to assess early pregnancy.

[Series 1: us ob transvaginal · 0.23mm/px · 14 of 83 slices shown]
[im 4/83]
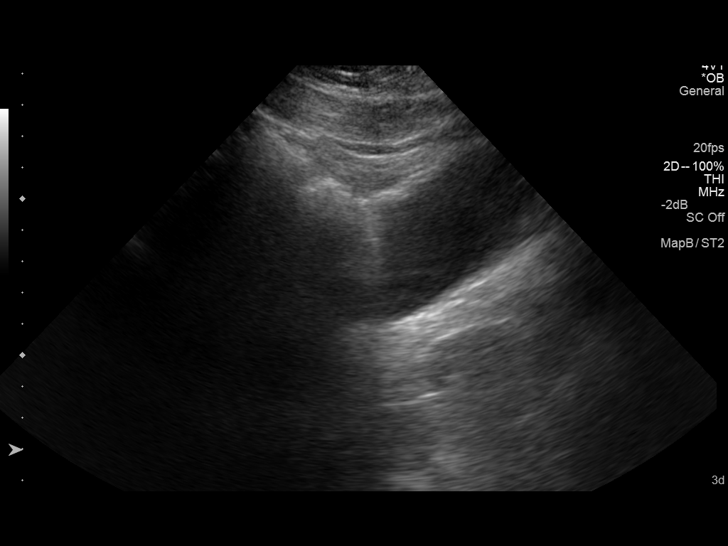
[im 10/83]
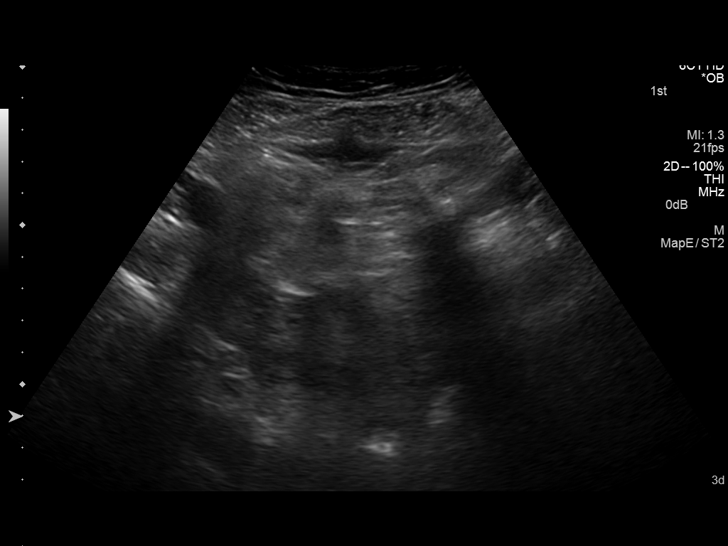
[im 16/83]
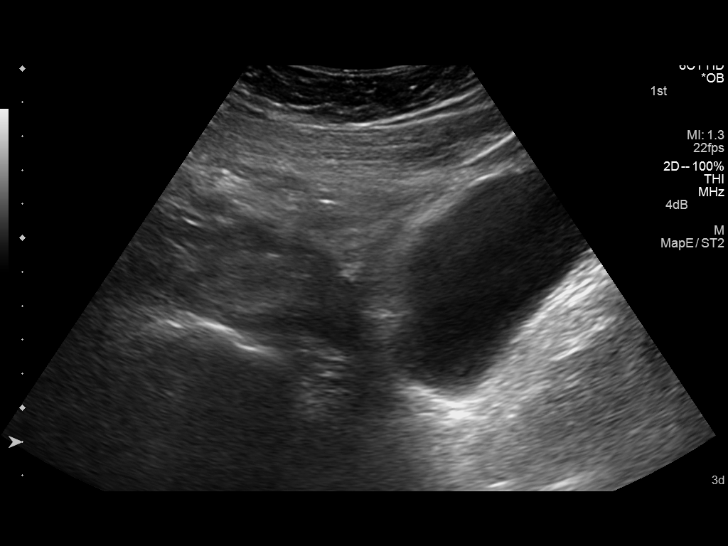
[im 22/83]
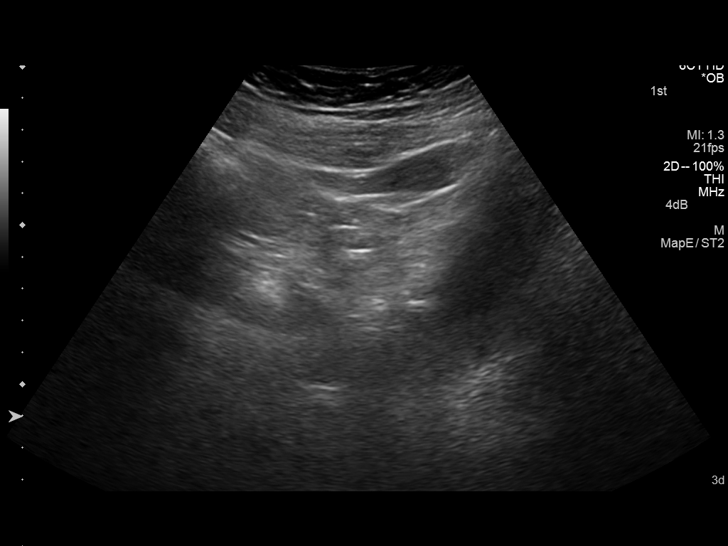
[im 28/83]
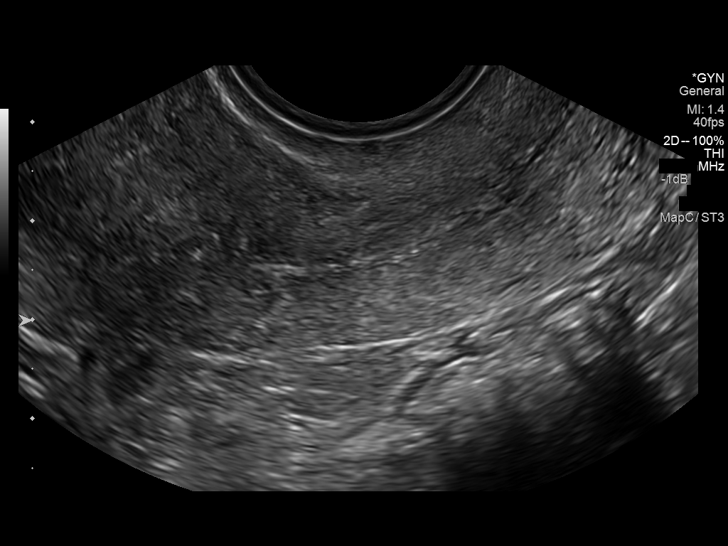
[im 34/83]
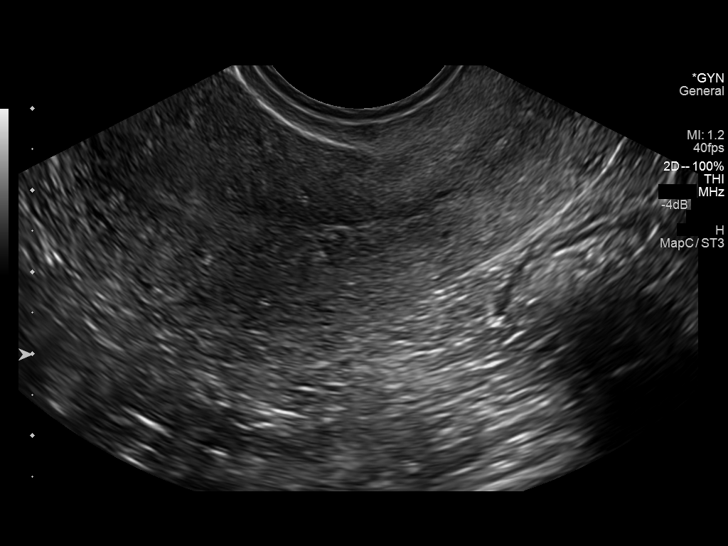
[im 40/83]
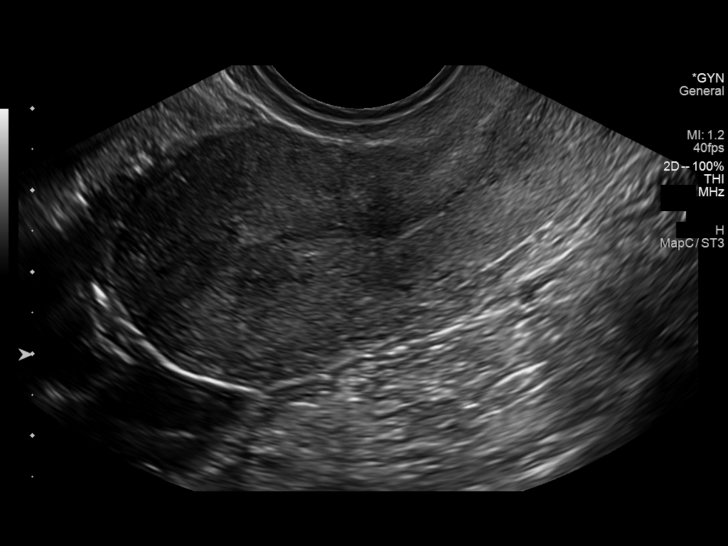
[im 46/83]
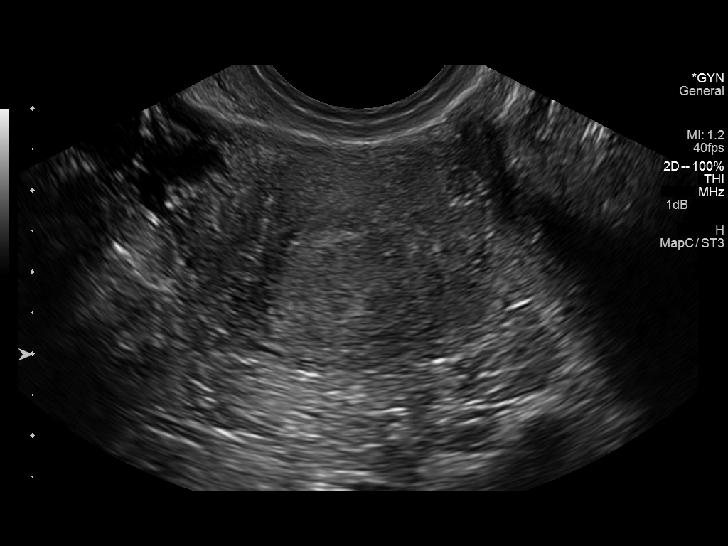
[im 52/83]
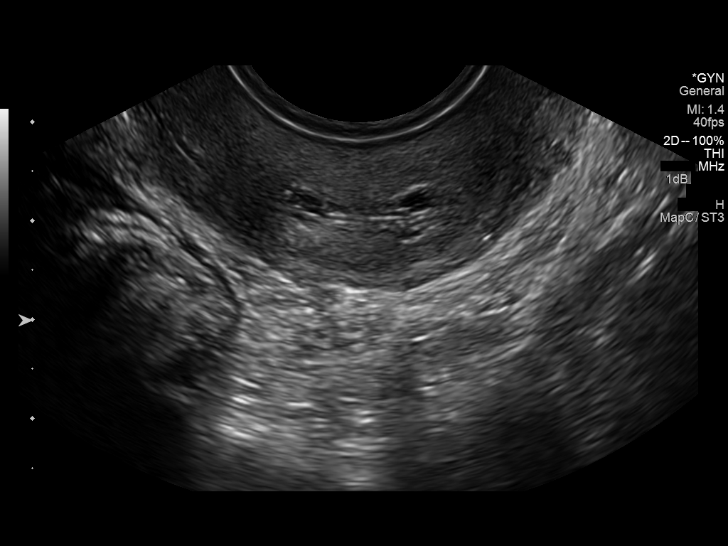
[im 58/83]
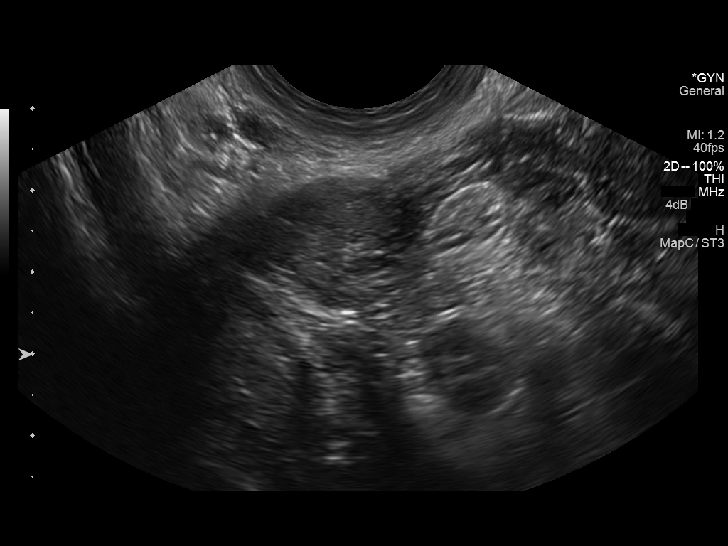
[im 64/83]
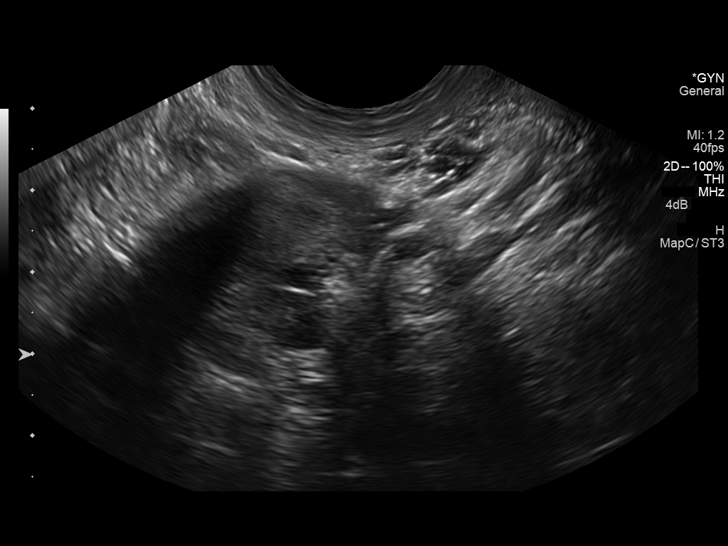
[im 70/83]
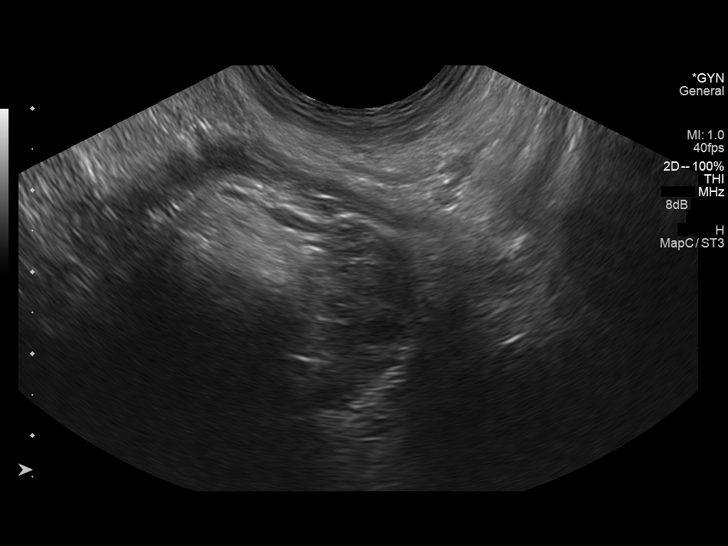
[im 76/83]
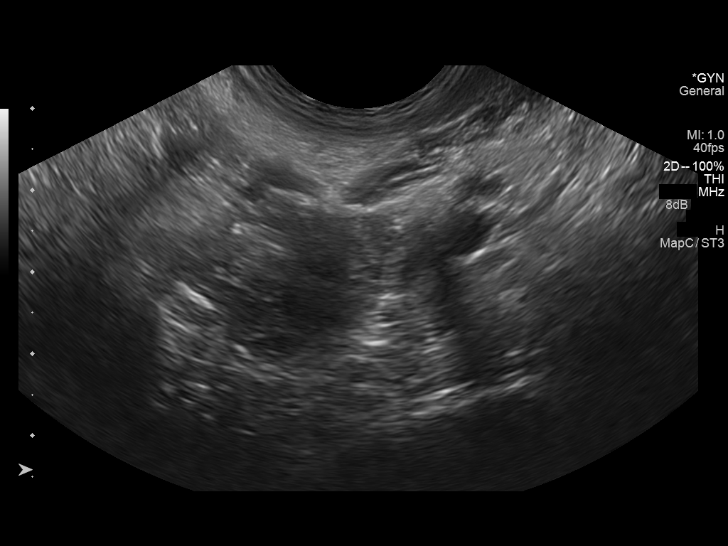
[im 83/83]
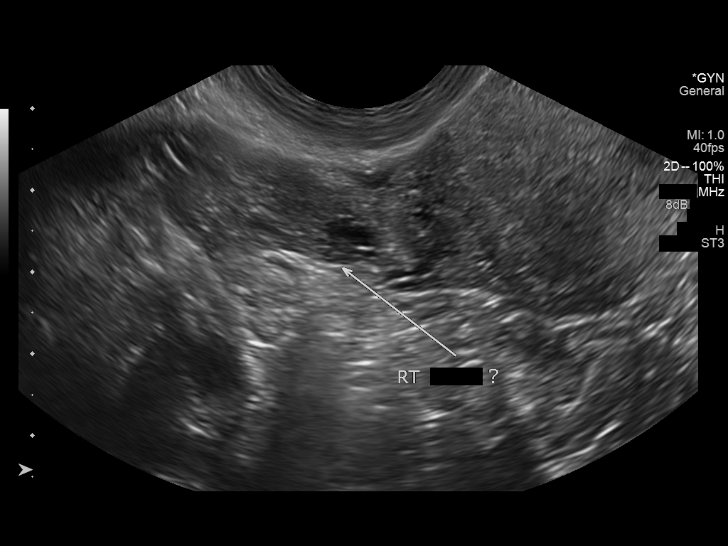

[14 of 28 positions shown; findings below may reference images not displayed]

FINDINGS: Intrauterine gestational sac: Not visualized

Yolk sac:  Not visualized

Embryo:  Not visualized

Cardiac Activity: Not visualized

Maternal uterus/adnexae: The ovaries are normal. 0.9 cm right
paraovarian simple appearing cyst incidentally noted. No adnexal
mass. No free fluid.
IMPRESSION: No intrauterine gestational sac, yolk sac, or fetal pole identified.
Differential considerations include intrauterine pregnancy too early
to be sonographically visualized, missed abortion, or ectopic
pregnancy. Followup ultrasound is recommended in 10-14 days for
further evaluation.

## 2016-08-25 ENCOUNTER — Encounter: Payer: Self-pay | Admitting: Gynecology
